# Patient Record
Sex: Female | Born: 1935 | Race: Black or African American | Hispanic: No | Smoking: Never smoker
Health system: Southern US, Community
[De-identification: ages and names within clinical notes are randomized; demographics above are authoritative.]

## PROBLEM LIST (undated history)

## (undated) DIAGNOSIS — M1990 Unspecified osteoarthritis, unspecified site: Secondary | ICD-10-CM

## (undated) DIAGNOSIS — M1710 Unilateral primary osteoarthritis, unspecified knee: Secondary | ICD-10-CM

## (undated) DIAGNOSIS — Z853 Personal history of malignant neoplasm of breast: Secondary | ICD-10-CM

## (undated) DIAGNOSIS — M25569 Pain in unspecified knee: Secondary | ICD-10-CM

## (undated) DIAGNOSIS — T50995A Adverse effect of other drugs, medicaments and biological substances, initial encounter: Secondary | ICD-10-CM

## (undated) DIAGNOSIS — N952 Postmenopausal atrophic vaginitis: Secondary | ICD-10-CM

## (undated) DIAGNOSIS — I9589 Other hypotension: Secondary | ICD-10-CM

## (undated) DIAGNOSIS — M069 Rheumatoid arthritis, unspecified: Secondary | ICD-10-CM

## (undated) DIAGNOSIS — R42 Dizziness and giddiness: Secondary | ICD-10-CM

## (undated) DIAGNOSIS — Z201 Contact with and (suspected) exposure to tuberculosis: Secondary | ICD-10-CM

## (undated) DIAGNOSIS — H6120 Impacted cerumen, unspecified ear: Secondary | ICD-10-CM

## (undated) DIAGNOSIS — R609 Edema, unspecified: Secondary | ICD-10-CM

## (undated) DIAGNOSIS — N390 Urinary tract infection, site not specified: Secondary | ICD-10-CM

## (undated) DIAGNOSIS — D509 Iron deficiency anemia, unspecified: Secondary | ICD-10-CM

## (undated) DIAGNOSIS — Z79899 Other long term (current) drug therapy: Secondary | ICD-10-CM

## (undated) DIAGNOSIS — I5032 Chronic diastolic (congestive) heart failure: Secondary | ICD-10-CM

## (undated) DIAGNOSIS — M810 Age-related osteoporosis without current pathological fracture: Secondary | ICD-10-CM

## (undated) DIAGNOSIS — E039 Hypothyroidism, unspecified: Secondary | ICD-10-CM

## (undated) DIAGNOSIS — M25539 Pain in unspecified wrist: Secondary | ICD-10-CM

## (undated) DIAGNOSIS — R300 Dysuria: Secondary | ICD-10-CM

## (undated) DIAGNOSIS — I4891 Unspecified atrial fibrillation: Principal | ICD-10-CM

## (undated) DIAGNOSIS — M2550 Pain in unspecified joint: Secondary | ICD-10-CM

## (undated) DIAGNOSIS — S42309A Unspecified fracture of shaft of humerus, unspecified arm, initial encounter for closed fracture: Secondary | ICD-10-CM

## (undated) DIAGNOSIS — I441 Atrioventricular block, second degree: Secondary | ICD-10-CM

## (undated) DIAGNOSIS — H259 Unspecified age-related cataract: Secondary | ICD-10-CM

## (undated) DIAGNOSIS — I10 Essential (primary) hypertension: Secondary | ICD-10-CM

## (undated) DIAGNOSIS — R350 Frequency of micturition: Secondary | ICD-10-CM

## (undated) DIAGNOSIS — D638 Anemia in other chronic diseases classified elsewhere: Secondary | ICD-10-CM

## (undated) DIAGNOSIS — K210 Gastro-esophageal reflux disease with esophagitis: Secondary | ICD-10-CM

## (undated) DIAGNOSIS — K5900 Constipation, unspecified: Secondary | ICD-10-CM

## (undated) DIAGNOSIS — R5381 Other malaise: Secondary | ICD-10-CM

## (undated) HISTORY — DX: Hypothyroidism, unspecified: E03.9

## (undated) HISTORY — DX: Other hypotension: I95.89

## (undated) HISTORY — DX: Unspecified fracture of shaft of humerus, unspecified arm, initial encounter for closed fracture: S42.309A

## (undated) HISTORY — DX: Urinary tract infection, site not specified: N39.0

## (undated) HISTORY — DX: Postmenopausal atrophic vaginitis: N95.2

## (undated) HISTORY — DX: Contact with and (suspected) exposure to tuberculosis: Z20.1

## (undated) HISTORY — DX: Chronic diastolic (congestive) heart failure: I50.32

## (undated) HISTORY — DX: Pain in unspecified wrist: M25.539

## (undated) HISTORY — DX: Adverse effect of other drugs, medicaments and biological substances, initial encounter: T50.995A

## (undated) HISTORY — PX: CATARACT EXTRACTION W/ INTRAOCULAR LENS  IMPLANT, BILATERAL: SHX1307

## (undated) HISTORY — DX: Impacted cerumen, unspecified ear: H61.20

## (undated) HISTORY — DX: Iron deficiency anemia, unspecified: D50.9

## (undated) HISTORY — DX: Anemia in other chronic diseases classified elsewhere: D63.8

## (undated) HISTORY — DX: Constipation, unspecified: K59.00

## (undated) HISTORY — DX: Unspecified age-related cataract: H25.9

## (undated) HISTORY — DX: Rheumatoid arthritis, unspecified: M06.9

## (undated) HISTORY — DX: Frequency of micturition: R35.0

## (undated) HISTORY — DX: Pain in unspecified knee: M25.569

## (undated) HISTORY — DX: Other malaise: R53.81

## (undated) HISTORY — DX: Gastro-esophageal reflux disease with esophagitis: K21.0

## (undated) HISTORY — DX: Edema, unspecified: R60.9

## (undated) HISTORY — DX: Dysuria: R30.0

## (undated) HISTORY — DX: Unspecified atrial fibrillation: I48.91

## (undated) HISTORY — DX: Unilateral primary osteoarthritis, unspecified knee: M17.10

## (undated) HISTORY — DX: Dizziness and giddiness: R42

## (undated) HISTORY — DX: Other long term (current) drug therapy: Z79.899

## (undated) HISTORY — PX: JOINT REPLACEMENT: SHX530

## (undated) HISTORY — DX: Age-related osteoporosis without current pathological fracture: M81.0

## (undated) HISTORY — DX: Personal history of malignant neoplasm of breast: Z85.3

## (undated) HISTORY — DX: Pain in unspecified joint: M25.50

## (undated) HISTORY — DX: Essential (primary) hypertension: I10

## (undated) HISTORY — DX: Atrioventricular block, second degree: I44.1

---

## 1958-06-27 DIAGNOSIS — Z201 Contact with and (suspected) exposure to tuberculosis: Secondary | ICD-10-CM

## 1958-06-27 HISTORY — DX: Contact with and (suspected) exposure to tuberculosis: Z20.1

## 1976-06-27 HISTORY — PX: APPENDECTOMY: SHX54

## 1976-06-27 HISTORY — PX: ABDOMINAL HYSTERECTOMY: SHX81

## 1996-06-27 DIAGNOSIS — Z853 Personal history of malignant neoplasm of breast: Secondary | ICD-10-CM

## 1996-06-27 HISTORY — DX: Personal history of malignant neoplasm of breast: Z85.3

## 1996-06-27 HISTORY — PX: MASTECTOMY: SHX3

## 1996-06-27 HISTORY — PX: BREAST SURGERY: SHX581

## 1997-09-03 ENCOUNTER — Ambulatory Visit (HOSPITAL_COMMUNITY): Admission: RE | Admit: 1997-09-03 | Discharge: 1997-09-03 | Payer: Self-pay | Admitting: General Surgery

## 1998-09-15 ENCOUNTER — Encounter: Payer: Self-pay | Admitting: *Deleted

## 1998-09-15 ENCOUNTER — Ambulatory Visit (HOSPITAL_COMMUNITY): Admission: RE | Admit: 1998-09-15 | Discharge: 1998-09-15 | Payer: Self-pay | Admitting: *Deleted

## 1999-09-29 ENCOUNTER — Ambulatory Visit (HOSPITAL_COMMUNITY): Admission: RE | Admit: 1999-09-29 | Discharge: 1999-09-29 | Payer: Self-pay | Admitting: General Surgery

## 2000-07-21 ENCOUNTER — Encounter: Payer: Self-pay | Admitting: Specialist

## 2000-07-21 ENCOUNTER — Emergency Department (HOSPITAL_COMMUNITY): Admission: EM | Admit: 2000-07-21 | Discharge: 2000-07-21 | Payer: Self-pay | Admitting: Emergency Medicine

## 2000-07-21 ENCOUNTER — Encounter: Admission: RE | Admit: 2000-07-21 | Discharge: 2000-07-21 | Payer: Self-pay | Admitting: Specialist

## 2000-10-03 ENCOUNTER — Encounter (HOSPITAL_BASED_OUTPATIENT_CLINIC_OR_DEPARTMENT_OTHER): Payer: Self-pay | Admitting: General Surgery

## 2000-10-03 ENCOUNTER — Ambulatory Visit (HOSPITAL_COMMUNITY): Admission: RE | Admit: 2000-10-03 | Discharge: 2000-10-03 | Payer: Self-pay | Admitting: General Surgery

## 2001-06-27 HISTORY — PX: EYE SURGERY: SHX253

## 2001-10-09 ENCOUNTER — Ambulatory Visit (HOSPITAL_COMMUNITY): Admission: RE | Admit: 2001-10-09 | Discharge: 2001-10-09 | Payer: Self-pay | Admitting: Obstetrics

## 2001-10-09 ENCOUNTER — Encounter: Payer: Self-pay | Admitting: Obstetrics

## 2002-11-06 ENCOUNTER — Encounter (HOSPITAL_BASED_OUTPATIENT_CLINIC_OR_DEPARTMENT_OTHER): Payer: Self-pay | Admitting: General Surgery

## 2002-11-06 ENCOUNTER — Ambulatory Visit (HOSPITAL_COMMUNITY): Admission: RE | Admit: 2002-11-06 | Discharge: 2002-11-06 | Payer: Self-pay | Admitting: General Surgery

## 2003-11-07 ENCOUNTER — Ambulatory Visit (HOSPITAL_COMMUNITY): Admission: RE | Admit: 2003-11-07 | Discharge: 2003-11-07 | Payer: Self-pay | Admitting: Obstetrics

## 2004-02-11 ENCOUNTER — Emergency Department (HOSPITAL_COMMUNITY): Admission: EM | Admit: 2004-02-11 | Discharge: 2004-02-11 | Payer: Self-pay | Admitting: *Deleted

## 2004-11-15 ENCOUNTER — Ambulatory Visit (HOSPITAL_COMMUNITY): Admission: RE | Admit: 2004-11-15 | Discharge: 2004-11-15 | Payer: Self-pay | Admitting: Obstetrics

## 2005-07-17 DIAGNOSIS — R42 Dizziness and giddiness: Secondary | ICD-10-CM

## 2005-07-17 HISTORY — DX: Dizziness and giddiness: R42

## 2005-11-24 ENCOUNTER — Ambulatory Visit (HOSPITAL_COMMUNITY): Admission: RE | Admit: 2005-11-24 | Discharge: 2005-11-24 | Payer: Self-pay | Admitting: Obstetrics

## 2006-06-27 DIAGNOSIS — IMO0002 Reserved for concepts with insufficient information to code with codable children: Secondary | ICD-10-CM

## 2006-06-27 HISTORY — DX: Reserved for concepts with insufficient information to code with codable children: IMO0002

## 2006-09-05 ENCOUNTER — Encounter: Admission: RE | Admit: 2006-09-05 | Discharge: 2006-09-05 | Payer: Self-pay | Admitting: Cardiology

## 2006-09-07 ENCOUNTER — Encounter: Admission: RE | Admit: 2006-09-07 | Discharge: 2006-09-07 | Payer: Self-pay | Admitting: Orthopedic Surgery

## 2006-10-05 ENCOUNTER — Encounter (HOSPITAL_COMMUNITY): Admission: RE | Admit: 2006-10-05 | Discharge: 2006-10-05 | Payer: Self-pay | Admitting: Cardiology

## 2006-10-17 ENCOUNTER — Inpatient Hospital Stay (HOSPITAL_COMMUNITY): Admission: RE | Admit: 2006-10-17 | Discharge: 2006-10-20 | Payer: Self-pay | Admitting: Orthopedic Surgery

## 2006-12-13 ENCOUNTER — Ambulatory Visit (HOSPITAL_COMMUNITY): Admission: RE | Admit: 2006-12-13 | Discharge: 2006-12-13 | Payer: Self-pay | Admitting: Obstetrics

## 2006-12-27 ENCOUNTER — Encounter: Admission: RE | Admit: 2006-12-27 | Discharge: 2006-12-27 | Payer: Self-pay | Admitting: Obstetrics

## 2007-02-13 ENCOUNTER — Encounter: Admission: RE | Admit: 2007-02-13 | Discharge: 2007-02-13 | Payer: Self-pay | Admitting: Orthopedic Surgery

## 2007-05-07 ENCOUNTER — Ambulatory Visit (HOSPITAL_COMMUNITY): Admission: RE | Admit: 2007-05-07 | Discharge: 2007-05-07 | Payer: Self-pay | Admitting: Orthopedic Surgery

## 2007-05-17 ENCOUNTER — Inpatient Hospital Stay (HOSPITAL_COMMUNITY): Admission: RE | Admit: 2007-05-17 | Discharge: 2007-05-21 | Payer: Self-pay | Admitting: Orthopedic Surgery

## 2007-12-17 ENCOUNTER — Ambulatory Visit (HOSPITAL_COMMUNITY): Admission: RE | Admit: 2007-12-17 | Discharge: 2007-12-17 | Payer: Self-pay | Admitting: Obstetrics

## 2008-12-17 ENCOUNTER — Ambulatory Visit (HOSPITAL_COMMUNITY): Admission: RE | Admit: 2008-12-17 | Discharge: 2008-12-17 | Payer: Self-pay | Admitting: Cardiology

## 2009-05-26 ENCOUNTER — Encounter: Admission: RE | Admit: 2009-05-26 | Discharge: 2009-05-26 | Payer: Self-pay | Admitting: Cardiology

## 2009-12-21 ENCOUNTER — Ambulatory Visit (HOSPITAL_COMMUNITY): Admission: RE | Admit: 2009-12-21 | Discharge: 2009-12-21 | Payer: Self-pay | Admitting: *Deleted

## 2010-04-08 ENCOUNTER — Encounter: Admission: RE | Admit: 2010-04-08 | Discharge: 2010-04-08 | Payer: Self-pay | Admitting: Cardiology

## 2010-06-27 DIAGNOSIS — I10 Essential (primary) hypertension: Secondary | ICD-10-CM

## 2010-06-27 DIAGNOSIS — R609 Edema, unspecified: Secondary | ICD-10-CM

## 2010-06-27 DIAGNOSIS — I441 Atrioventricular block, second degree: Secondary | ICD-10-CM

## 2010-06-27 DIAGNOSIS — E039 Hypothyroidism, unspecified: Secondary | ICD-10-CM

## 2010-06-27 DIAGNOSIS — I5032 Chronic diastolic (congestive) heart failure: Secondary | ICD-10-CM

## 2010-06-27 HISTORY — DX: Atrioventricular block, second degree: I44.1

## 2010-06-27 HISTORY — DX: Edema, unspecified: R60.9

## 2010-06-27 HISTORY — DX: Essential (primary) hypertension: I10

## 2010-06-27 HISTORY — DX: Chronic diastolic (congestive) heart failure: I50.32

## 2010-06-27 HISTORY — DX: Hypothyroidism, unspecified: E03.9

## 2010-06-29 ENCOUNTER — Encounter
Admission: RE | Admit: 2010-06-29 | Discharge: 2010-06-29 | Payer: Self-pay | Source: Home / Self Care | Attending: Cardiology | Admitting: Cardiology

## 2010-07-06 ENCOUNTER — Inpatient Hospital Stay (HOSPITAL_COMMUNITY)
Admission: EM | Admit: 2010-07-06 | Discharge: 2010-07-16 | Payer: Self-pay | Source: Home / Self Care | Attending: Cardiology | Admitting: Cardiology

## 2010-07-07 ENCOUNTER — Encounter (INDEPENDENT_AMBULATORY_CARE_PROVIDER_SITE_OTHER): Payer: Self-pay | Admitting: Cardiology

## 2010-07-12 ENCOUNTER — Ambulatory Visit: Admit: 2010-07-12 | Payer: Self-pay | Admitting: Cardiology

## 2010-07-12 LAB — PROTIME-INR
INR: 1.05 (ref 0.00–1.49)
Prothrombin Time: 13.9 seconds (ref 11.6–15.2)

## 2010-07-12 LAB — DIFFERENTIAL
Basophils Absolute: 0 10*3/uL (ref 0.0–0.1)
Basophils Relative: 0 % (ref 0–1)
Eosinophils Absolute: 0 10*3/uL (ref 0.0–0.7)
Eosinophils Relative: 0 % (ref 0–5)
Lymphocytes Relative: 16 % (ref 12–46)
Lymphs Abs: 1.9 10*3/uL (ref 0.7–4.0)
Monocytes Absolute: 1.3 10*3/uL — ABNORMAL HIGH (ref 0.1–1.0)
Monocytes Relative: 11 % (ref 3–12)
Neutro Abs: 8.2 10*3/uL — ABNORMAL HIGH (ref 1.7–7.7)
Neutrophils Relative %: 72 % (ref 43–77)

## 2010-07-12 LAB — CBC
HCT: 32.3 % — ABNORMAL LOW (ref 36.0–46.0)
HCT: 26.2 % — ABNORMAL LOW (ref 36.0–46.0)
HCT: 24.5 % — ABNORMAL LOW (ref 36.0–46.0)
HCT: 27.4 % — ABNORMAL LOW (ref 36.0–46.0)
HCT: 31 % — ABNORMAL LOW (ref 36.0–46.0)
Hemoglobin: 10.4 g/dL — ABNORMAL LOW (ref 12.0–15.0)
Hemoglobin: 8.4 g/dL — ABNORMAL LOW (ref 12.0–15.0)
Hemoglobin: 7.7 g/dL — ABNORMAL LOW (ref 12.0–15.0)
Hemoglobin: 8.8 g/dL — ABNORMAL LOW (ref 12.0–15.0)
Hemoglobin: 10.1 g/dL — ABNORMAL LOW (ref 12.0–15.0)
MCH: 27.4 pg (ref 26.0–34.0)
MCH: 27.5 pg (ref 26.0–34.0)
MCH: 26.8 pg (ref 26.0–34.0)
MCH: 27.5 pg (ref 26.0–34.0)
MCH: 28 pg (ref 26.0–34.0)
MCHC: 32.2 g/dL (ref 30.0–36.0)
MCHC: 32.1 g/dL (ref 30.0–36.0)
MCHC: 31.4 g/dL (ref 30.0–36.0)
MCHC: 32.1 g/dL (ref 30.0–36.0)
MCHC: 32.6 g/dL (ref 30.0–36.0)
MCV: 85 fL (ref 78.0–100.0)
MCV: 85.6 fL (ref 78.0–100.0)
MCV: 85.4 fL (ref 78.0–100.0)
MCV: 85.6 fL (ref 78.0–100.0)
MCV: 85.9 fL (ref 78.0–100.0)
Platelets: 302 10*3/uL (ref 150–400)
Platelets: 271 10*3/uL (ref 150–400)
Platelets: 231 10*3/uL (ref 150–400)
Platelets: 221 10*3/uL (ref 150–400)
Platelets: 233 10*3/uL (ref 150–400)
RBC: 3.8 MIL/uL — ABNORMAL LOW (ref 3.87–5.11)
RBC: 3.06 MIL/uL — ABNORMAL LOW (ref 3.87–5.11)
RBC: 2.87 MIL/uL — ABNORMAL LOW (ref 3.87–5.11)
RBC: 3.2 MIL/uL — ABNORMAL LOW (ref 3.87–5.11)
RBC: 3.61 MIL/uL — ABNORMAL LOW (ref 3.87–5.11)
RDW: 14.3 % (ref 11.5–15.5)
RDW: 14.6 % (ref 11.5–15.5)
RDW: 14.6 % (ref 11.5–15.5)
RDW: 14.6 % (ref 11.5–15.5)
RDW: 15 % (ref 11.5–15.5)
WBC: 11.4 10*3/uL — ABNORMAL HIGH (ref 4.0–10.5)
WBC: 10 10*3/uL (ref 4.0–10.5)
WBC: 9.4 10*3/uL (ref 4.0–10.5)
WBC: 9.5 10*3/uL (ref 4.0–10.5)
WBC: 10.7 10*3/uL — ABNORMAL HIGH (ref 4.0–10.5)

## 2010-07-12 LAB — BASIC METABOLIC PANEL
BUN: 9 mg/dL (ref 6–23)
BUN: 4 mg/dL — ABNORMAL LOW (ref 6–23)
BUN: 8 mg/dL (ref 6–23)
CO2: 22 mEq/L (ref 19–32)
CO2: 23 mEq/L (ref 19–32)
CO2: 25 mEq/L (ref 19–32)
Calcium: 7.5 mg/dL — ABNORMAL LOW (ref 8.4–10.5)
Calcium: 8 mg/dL — ABNORMAL LOW (ref 8.4–10.5)
Calcium: 8.4 mg/dL (ref 8.4–10.5)
Chloride: 110 mEq/L (ref 96–112)
Chloride: 114 mEq/L — ABNORMAL HIGH (ref 96–112)
Chloride: 110 mEq/L (ref 96–112)
Creatinine, Ser: 1.19 mg/dL (ref 0.4–1.2)
Creatinine, Ser: 1.07 mg/dL (ref 0.4–1.2)
Creatinine, Ser: 1.12 mg/dL (ref 0.4–1.2)
GFR calc Af Amer: 52 mL/min — ABNORMAL LOW (ref >60)
GFR calc Af Amer: 59 mL/min — ABNORMAL LOW (ref >60)
GFR calc Af Amer: 56 mL/min — ABNORMAL LOW (ref >60)
GFR calc non Af Amer: 43 mL/min — ABNORMAL LOW (ref >60)
GFR calc non Af Amer: 48 mL/min — ABNORMAL LOW (ref >60)
GFR calc non Af Amer: 46 mL/min — ABNORMAL LOW (ref >60)
Glucose, Bld: 115 mg/dL — ABNORMAL HIGH (ref 70–99)
Glucose, Bld: 116 mg/dL — ABNORMAL HIGH (ref 70–99)
Glucose, Bld: 84 mg/dL (ref 70–99)
Potassium: 3.3 mEq/L — ABNORMAL LOW (ref 3.5–5.1)
Potassium: 3.8 mEq/L (ref 3.5–5.1)
Potassium: 3.9 mEq/L (ref 3.5–5.1)
Sodium: 138 mEq/L (ref 135–145)
Sodium: 141 mEq/L (ref 135–145)
Sodium: 141 mEq/L (ref 135–145)

## 2010-07-12 LAB — TROPONIN I: Troponin I: 0.12 ng/mL — ABNORMAL HIGH (ref 0.00–0.06)

## 2010-07-12 LAB — CARDIAC PANEL(CRET KIN+CKTOT+MB+TROPI)
CK, MB: 7 ng/mL (ref 0.3–4.0)
CK, MB: 4.8 ng/mL — ABNORMAL HIGH (ref 0.3–4.0)
CK, MB: 3.8 ng/mL (ref 0.3–4.0)
Relative Index: INVALID (ref 0.0–2.5)
Relative Index: INVALID (ref 0.0–2.5)
Relative Index: INVALID (ref 0.0–2.5)
Total CK: 50 U/L (ref 7–177)
Total CK: 43 U/L (ref 7–177)
Total CK: 48 U/L (ref 7–177)
Troponin I: 0.17 ng/mL — ABNORMAL HIGH (ref 0.00–0.06)
Troponin I: 0.16 ng/mL — ABNORMAL HIGH (ref 0.00–0.06)
Troponin I: 0.11 ng/mL — ABNORMAL HIGH (ref 0.00–0.06)

## 2010-07-12 LAB — FERRITIN: Ferritin: 725 ng/mL — ABNORMAL HIGH (ref 10–291)

## 2010-07-12 LAB — COMPREHENSIVE METABOLIC PANEL
ALT: 43 U/L — ABNORMAL HIGH (ref 0–35)
AST: 32 U/L (ref 0–37)
Albumin: 2.8 g/dL — ABNORMAL LOW (ref 3.5–5.2)
Alkaline Phosphatase: 76 U/L (ref 39–117)
BUN: 20 mg/dL (ref 6–23)
CO2: 26 mEq/L (ref 19–32)
Calcium: 8.8 mg/dL (ref 8.4–10.5)
Chloride: 104 mEq/L (ref 96–112)
Creatinine, Ser: 1.58 mg/dL — ABNORMAL HIGH (ref 0.4–1.2)
GFR calc Af Amer: 37 mL/min — ABNORMAL LOW (ref >60)
GFR calc non Af Amer: 31 mL/min — ABNORMAL LOW (ref >60)
Glucose, Bld: 119 mg/dL — ABNORMAL HIGH (ref 70–99)
Potassium: 4.6 mEq/L (ref 3.5–5.1)
Sodium: 138 mEq/L (ref 135–145)
Total Bilirubin: 0.7 mg/dL (ref 0.3–1.2)
Total Protein: 5.7 g/dL — ABNORMAL LOW (ref 6.0–8.3)

## 2010-07-12 LAB — FOLATE: Folate: >20 ng/mL

## 2010-07-12 LAB — CROSSMATCH
ABO/RH(D): O POS
Antibody Screen: NEGATIVE
Unit division: 0
Unit division: 0

## 2010-07-12 LAB — IRON AND TIBC
Iron: 24 ug/dL — ABNORMAL LOW (ref 42–135)
Iron: 31 ug/dL — ABNORMAL LOW (ref 42–135)
Saturation Ratios: 15 % — ABNORMAL LOW (ref 20–55)
Saturation Ratios: 19 % — ABNORMAL LOW (ref 20–55)
TIBC: 155 ug/dL — ABNORMAL LOW (ref 250–470)
TIBC: 159 ug/dL — ABNORMAL LOW (ref 250–470)
UIBC: 131 ug/dL
UIBC: 128 ug/dL

## 2010-07-12 LAB — POCT CARDIAC MARKERS
CKMB, poc: 7.2 ng/mL (ref 1.0–8.0)
CKMB, poc: 6.2 ng/mL (ref 1.0–8.0)
Myoglobin, poc: 200 ng/mL (ref 12–200)
Myoglobin, poc: 163 ng/mL (ref 12–200)
Troponin i, poc: 0.11 ng/mL — ABNORMAL HIGH (ref 0.00–0.09)
Troponin i, poc: 0.12 ng/mL — ABNORMAL HIGH (ref 0.00–0.09)

## 2010-07-12 LAB — MRSA PCR SCREENING: MRSA by PCR: NEGATIVE

## 2010-07-12 LAB — URINALYSIS, ROUTINE W REFLEX MICROSCOPIC
Bilirubin Urine: NEGATIVE
Hgb urine dipstick: NEGATIVE
Ketones, ur: NEGATIVE mg/dL
Nitrite: NEGATIVE
Protein, ur: NEGATIVE mg/dL
Specific Gravity, Urine: 1.012 (ref 1.005–1.030)
Urine Glucose, Fasting: NEGATIVE mg/dL
Urobilinogen, UA: 0.2 mg/dL (ref 0.0–1.0)
pH: 6 (ref 5.0–8.0)

## 2010-07-12 LAB — BRAIN NATRIURETIC PEPTIDE
Pro B Natriuretic peptide (BNP): 225 pg/mL — ABNORMAL HIGH (ref 0.0–100.0)
Pro B Natriuretic peptide (BNP): 516 pg/mL — ABNORMAL HIGH (ref 0.0–100.0)
Pro B Natriuretic peptide (BNP): 516 pg/mL — ABNORMAL HIGH (ref 0.0–100.0)

## 2010-07-12 LAB — URINE CULTURE
Colony Count: NO GROWTH
Culture  Setup Time: 201201102015
Culture: NO GROWTH

## 2010-07-12 LAB — T4: T4, Total: 10.2 ug/dL (ref 5.0–12.5)

## 2010-07-12 LAB — VITAMIN B12: Vitamin B-12: 480 pg/mL (ref 211–911)

## 2010-07-12 LAB — AMYLASE: Amylase: 33 U/L (ref 0–105)

## 2010-07-12 LAB — DIGOXIN LEVEL
Digoxin Level: 2.8 ng/mL (ref 0.8–2.0)
Digoxin Level: 0.8 ng/mL (ref 0.8–2.0)

## 2010-07-12 LAB — CK TOTAL AND CKMB (NOT AT ARMC)
CK, MB: 4.1 ng/mL — ABNORMAL HIGH (ref 0.3–4.0)
Relative Index: INVALID (ref 0.0–2.5)
Total CK: 45 U/L (ref 7–177)

## 2010-07-12 LAB — PREALBUMIN: Prealbumin: 23 mg/dL (ref 17.0–34.0)

## 2010-07-12 LAB — APTT: aPTT: 26 seconds (ref 24–37)

## 2010-07-12 LAB — HEMOCCULT GUIAC POC 1CARD (OFFICE): Fecal Occult Bld: NEGATIVE

## 2010-07-12 LAB — LIPASE, BLOOD: Lipase: 15 U/L (ref 11–59)

## 2010-07-12 LAB — TSH: TSH: 0.011 u[IU]/mL — ABNORMAL LOW (ref 0.350–4.500)

## 2010-07-14 LAB — BASIC METABOLIC PANEL
BUN: 10 mg/dL (ref 6–23)
BUN: 9 mg/dL (ref 6–23)
CO2: 25 mEq/L (ref 19–32)
CO2: 27 mEq/L (ref 19–32)
Calcium: 8.3 mg/dL — ABNORMAL LOW (ref 8.4–10.5)
Calcium: 8.6 mg/dL (ref 8.4–10.5)
Chloride: 106 mEq/L (ref 96–112)
Chloride: 109 mEq/L (ref 96–112)
Creatinine, Ser: 1.14 mg/dL (ref 0.4–1.2)
Creatinine, Ser: 1.03 mg/dL (ref 0.4–1.2)
GFR calc Af Amer: 54 mL/min — ABNORMAL LOW (ref >60)
GFR calc Af Amer: >60 mL/min (ref >60)
GFR calc non Af Amer: 45 mL/min — ABNORMAL LOW (ref >60)
GFR calc non Af Amer: 51 mL/min — ABNORMAL LOW (ref >60)
Glucose, Bld: 89 mg/dL (ref 70–99)
Glucose, Bld: 85 mg/dL (ref 70–99)
Potassium: 3.8 mEq/L (ref 3.5–5.1)
Potassium: 3.9 mEq/L (ref 3.5–5.1)
Sodium: 137 mEq/L (ref 135–145)
Sodium: 141 mEq/L (ref 135–145)

## 2010-07-14 LAB — CULTURE, BLOOD (ROUTINE X 2)
Culture  Setup Time: 201201110549
Culture  Setup Time: 201201110549
Culture: NO GROWTH
Culture: NO GROWTH

## 2010-07-14 LAB — BRAIN NATRIURETIC PEPTIDE
Pro B Natriuretic peptide (BNP): 341 pg/mL — ABNORMAL HIGH (ref 0.0–100.0)
Pro B Natriuretic peptide (BNP): 299 pg/mL — ABNORMAL HIGH (ref 0.0–100.0)

## 2010-07-14 LAB — CBC
HCT: 31.6 % — ABNORMAL LOW (ref 36.0–46.0)
Hemoglobin: 10.1 g/dL — ABNORMAL LOW (ref 12.0–15.0)
MCH: 27.5 pg (ref 26.0–34.0)
MCHC: 32 g/dL (ref 30.0–36.0)
MCV: 86.1 fL (ref 78.0–100.0)
Platelets: 234 10*3/uL (ref 150–400)
RBC: 3.67 MIL/uL — ABNORMAL LOW (ref 3.87–5.11)
RDW: 15.3 % (ref 11.5–15.5)
WBC: 11.2 10*3/uL — ABNORMAL HIGH (ref 4.0–10.5)

## 2010-07-14 LAB — GLUCOSE, CAPILLARY: Glucose-Capillary: 119 mg/dL — ABNORMAL HIGH (ref 70–99)

## 2010-07-14 LAB — CARDIAC PANEL(CRET KIN+CKTOT+MB+TROPI)
CK, MB: 1.4 ng/mL (ref 0.3–4.0)
Relative Index: INVALID (ref 0.0–2.5)
Total CK: 17 U/L (ref 7–177)
Troponin I: 0.04 ng/mL (ref 0.00–0.06)

## 2010-07-16 DIAGNOSIS — R5381 Other malaise: Secondary | ICD-10-CM

## 2010-07-16 HISTORY — DX: Other malaise: R53.81

## 2010-07-17 DIAGNOSIS — I9589 Other hypotension: Secondary | ICD-10-CM

## 2010-07-17 DIAGNOSIS — T50995A Adverse effect of other drugs, medicaments and biological substances, initial encounter: Secondary | ICD-10-CM

## 2010-07-17 HISTORY — DX: Other hypotension: I95.89

## 2010-07-17 HISTORY — DX: Adverse effect of other drugs, medicaments and biological substances, initial encounter: T50.995A

## 2010-07-18 ENCOUNTER — Encounter: Payer: Self-pay | Admitting: Obstetrics

## 2010-07-19 LAB — PROTIME-INR: INR: 1.05 (ref 0.00–1.49)

## 2010-07-19 LAB — BASIC METABOLIC PANEL
BUN: 12 mg/dL (ref 6–23)
Calcium: 8.7 mg/dL (ref 8.4–10.5)
GFR calc non Af Amer: 46 mL/min — ABNORMAL LOW (ref >60)
Potassium: 3.8 mEq/L (ref 3.5–5.1)
Sodium: 140 mEq/L (ref 135–145)

## 2010-07-19 LAB — CBC
Platelets: 284 10*3/uL (ref 150–400)
RDW: 15.4 % (ref 11.5–15.5)
WBC: 9 10*3/uL (ref 4.0–10.5)

## 2010-07-19 LAB — HEPARIN LEVEL (UNFRACTIONATED): Heparin Unfractionated: 0.35 IU/mL (ref 0.30–0.70)

## 2010-07-20 DIAGNOSIS — M2550 Pain in unspecified joint: Secondary | ICD-10-CM

## 2010-07-20 HISTORY — DX: Pain in unspecified joint: M25.50

## 2010-07-23 NOTE — Consult Note (Signed)
NAME:  Kimberly Gaines, Kimberly Gaines NO.:  [id number]  MEDICAL RECORD NO.:  [id number]          PATIENT TYPE:  INP  LOCATION:  6729                         FACILITY:  [facility]  PHYSICIAN:  Jordan Hawks. Elnoria Howard, MD    DATE OF BIRTH:  Jan 03, 1920  DATE OF CONSULTATION:  07/13/2010 DATE OF DISCHARGE:                                CONSULTATION   REFERRING PHYSICIAN:  Osvaldo Shipper. Spruill, MD  CARDIOLOGIST:  Cristy Hilts. Jacinto Halim, MD  REASON FOR CONSULTATION:  Anemia.  HISTORY OF PRESENT ILLNESS:  This is an 77 year old female with a past medical history of hyperthyroidism, hypertension, and chronic anemia who was admitted to the hospital with complaints of nausea and vomiting. During the course of her hospitalization, she was noted to be toxic from digoxin and subsequently she was also confirmed to have a non-ST MI. She has improved steadily over the time and currently she is without any complaints of chest pain or shortness of breath and also she does not have any further nausea and vomiting.  Her digoxin level has normalized at this time.  During this interval time, her hemoglobin was found to have dropped down into the 7 range.  Her baseline hemoglobin appears to range anywhere from 9-11.  There are no reports of any hematochezia or melena.  No abdominal pain, diarrhea, or constipation.  In the past, the patient was evaluated in my office in 2009 for hematochezia complaints. At that time, she was recommended to undergo colonoscopy, however, there appeared to be some issue with hypertension, and the colonoscopy was cancelled, however, upon trying to reschedule the patient, it was difficult to get back in touch with the patient.  At this time, GI is requested for a repeat evaluation of this patient's anemia.  PAST MEDICAL HISTORY AND PAST SURGICAL HISTORY:  As stated above.  FAMILY HISTORY:  Noncontributory.  SOCIAL HISTORY:  Negative for alcohol, tobacco, or illicit drug use.  REVIEW OF  SYSTEMS:  As stated above in the history of present illness, otherwise negative.  MEDICATIONS: 1. Aspirin 81 mg p.o. daily. 2. Carvedilol 3.125 mg p.o. b.i.d. 3. Colace 100 mg p.o. daily. 4. Lovenox 30 mg subcu daily. 5. Iron is 325 mg p.o. b.i.d. 6. Isosorbide 60 mg p.o. daily. 7. Levothyroxine 50 mcg p.o. daily. 8. Protonix 80 mg p.o. daily. 9. Prednisone 10 mg p.o. daily. 10.Spironolactone 25 mg p.o. b.i.d. 11.Milk of magnesia 30 mL p.o. daily p.r.n.  ALLERGIES:  No known drug allergies.  PHYSICAL EXAMINATION:  VITAL SIGNS:  Blood pressure is 155/79, heart rate is 62, respirations 18, temperature is 98.2. GENERAL:  The patient is in no acute distress, alert, and oriented. HEENT:  Normocephalic, atraumatic.  Extraocular muscles are intact. NECK:  Supple.  No lymphadenopathy. LUNGS:  Clear to auscultation bilaterally. CARDIOVASCULAR:  Regular rate and rhythm. ABDOMEN:  Flat, soft, nontender, and nondistended. EXTREMITIES:  No clubbing, cyanosis, or edema.  LABORATORY VALUES:  White blood cell count is 11.2, hemoglobin 10.1, MCV is 86.1, platelets at 234.  Sodium is 141, potassium 3.9, chloride 109, CO2 of 27, glucose 85, BUN 9, creatinine 1.0.  BNP is 299.  IMPRESSION: 1. Chronic anemia.  2. Non-ST segment myocardial infarction. 3. Congestive heart failure. 4. Recent digoxin toxicity.  At this time, the patient appears to be stable.  I do not believe that there is any emergent need to perform an endoscopic workup. Additionally, because of her non-ST MI, an endoscopic workup at this time is not prudent.  She at least for the colonoscopy, 6 months will need to elapse before consideration for colonoscopy.  She has not had any further hematochezia since the 2009 evaluation.  Her baseline anemia does range anywhere from 9-11 and her iron saturation is at 90% which is borderline.  MCV is within normal range.  I doubt that the patient has any significant GI source.  I cannot  completely explain the anemia could be from chronic disease issues.  Again, she is Hemoccult negative currently.  PLAN: 1. I will have the patient follow up with me in the office, and I will     track her hemoglobin along.  Further recommendations will be made     as to the timing and if it is prudent to perform endoscopic     evaluation at this time. 2. Continue with the current cardiac care.     Jordan Hawks Elnoria Howard, MD     PDH/MEDQ  D:  07/13/2010  T:  07/14/2010  Job:  [id number]  cc:   Osvaldo Shipper. Spruill, M.D.  Electronically Signed by Jeani Hawking MD on 07/21/2010 07:18:19 AM

## 2010-08-04 NOTE — Discharge Summary (Signed)
NAME:  Kimberly Gaines, Kimberly Gaines NO.:  [id number]  MEDICAL RECORD NO.:  [id number]          PATIENT TYPE:  INP  LOCATION:  6729                         FACILITY:  [facility]  PHYSICIAN:  Osvaldo Shipper. Kennieth Plotts, M.D.DATE OF BIRTH:  23-Mar-1920  DATE OF ADMISSION:  07/06/2010 DATE OF DISCHARGE:                        DISCHARGE SUMMARY - REFERRING   PROVISIONAL DIAGNOSIS: 1. Possible non-ST elevated myocardial infarction. 2. Orthostatic hypotension. 3. Dehydration. 4. Mild renal insufficiency. 5. Mild anemia. 6. Leukocytosis. 7. Protein calorie malnutrition. 8. Sinus pauses.  DISCHARGE DIAGNOSES: 1. Diastolic heart failure. 2. Digoxin toxicity. 3. Iron deficiency anemia. 4. Hypotension secondary to anemia as well as dehydration. 5. Mobitz 2 block. 6. Hypokalemia. 7. Mild nutrition protein calorie.  BRIEF HISTORY AND REASON FOR ADMISSION:  This 77 year old black woman who was very active and a resident [facility] facility had developed gradual difficulty with loss of appetite and vomiting on several occasions.  The patient had began to lose weight and had developed profound weakness.  The patient had orthostatic blood pressures taken by the nurse at [facility] and found to have at least a 20 point graded drop in her blood pressure.  The patient had lost her appetite and had become dehydrated and subsequently was admitted to the hospital for further evaluation where she became extremely dizzy.  The patient had been previously seen by orthopedist, had been prescribed medication for degenerative joint disease, which she continues to have difficulty with and also been taking Aleve on her own.  Due to the patient's complaints of weakness, dizziness, poor appetite, apparent clinical dehydration, she was admitted for further evaluation and treatment.  LABORATORY STUDIES: 1. The admission laboratory blood work revealed the following, on     January 10, white  blood cell count recorded at 11,400, hemoglobin     10.4, hematocrit 32.3.  The cardiac markers on initial presentation     reveal evidence of troponin I of  0.12, myoglobin 163.  Urinalysis     on January 10, yellow clear, specific gravity 1.012, negative for     glucose, bilirubin, ketones or blood.  The MRSA PCR screen was     negative. 2. B-type natriuretic peptide was recorded 225, elevated normal is     between 0 and 100. Serum amylase of 33 on admission and subsequent     followup cardiac panel revealed a CK-MB of 7.0, creatinine kinase     was 50, troponin was 0.17.  Serum digoxin level was 2.8, normal was     0.82 and lipase was recorded at 15. 3. CBC on January 11 revealed hemoglobin of 8.4 a drop from the prior     and hematocrit of 26.2.  Cardiac panel on the 11th, revealed a CK-     MB of 4.8, troponin 0.16, CK-MB followup revealed a CK of 4.1,     troponin I of 0.12. 4. thyroid function was essentially unremarkable.  Serum iron was     recorded at 24, which is low, total iron-binding capacity 155 was     also low, saturation 15 was also low.  The patient's cardiac panel     on January 11 revealed troponin  0.11.  Urine culture revealed no     growth.  Stool for occult blood on January 12 was negative. 5. B-type natriuretic peptide on January 13 revealed a level of 516,     indicative of heart failure and the patient's hemoglobin and     hematocrit had dropped to 8.8 and 27.4 respectively. 6. B-type natriuretic peptide on January 17 was 299 and the patient     had some blood cultures performed as well due to her elevated white     blood cell count and cultures were unrevealing.  RADIOLOGIC STUDIES:  On January 3 her chest x-ray revealed mild cardiomegaly, no active lung disease.  X-ray on January 10 revealed enlargement of the cardiac silhouette, no evidence of any congestion, minimal atelectasis in the right lung base.  PROCEDURES:  Cardiac catheterization was performed  on January 19 and revealed the patient to have an ejection fraction of 50%-55%.  She likely had some diastolic dysfunction.  The left anterior descending coronary artery had a 10%-15% proximal and a 30%-40% mid-stenosis beyond the diagonal 2, circumflex was unremarkable.  The right coronary had a 10%-15% mid-stenosis.  CONSULTATIONS: 1. Dr. Jeani Hawking. gastroenterologist evaluated the patient for     possible GI bleeding and it was decided to delay her endoscopy. 2. Dr. Yates Decamp evaluated the patient for possible myocardial     infarction but we have decided to treat the patient conservatively     at the present.  HOSPITAL COURSE:  The patient was initially admitted under my care with weakness, nausea, vomiting, orthostatic hypotension.  After being admitted, baseline laboratory studies were obtained and are included in the body of the patient's chart.  These studies did reveal the patient to be mildly dehydrated with some early renal insufficiency.  After initial presentation, stool guaiacs  were requested x3 but were unremarkable.  The patient had serum iron iron-binding capacity, which revealed evidence of an iron deficiency anemia.  Due to the patient's elevated white blood cell count, blood cultures were performed and unremarkable.  Serum digoxin level did reveal an elevated digoxin level, which could account for the patient's nausea.  She was continued on medications of prednisone 10 mg p.o. daily and Synthroid 55 mcg p.o. daily.  Her digoxin was discontinued.  The patient continued to be quite weak and when she was rehydrated, revealed evidence of underlying anemia.  Anemia panel was requested and it revealed decreased serum iron and binding capacity and she was started on ferrous sulfate 325 mg twice a day.  The patient had some mild problems with hypokalemia.  This was addressed.  Also had evidence of heart failure as her B-type natriuretic peptide was elevated.  On  July 08, 2010 the patient was typed and crossed for 2 units of packed red cells and was noted that after rehydration, her hemoglobin/hematocrit had dropped.  The patient was symptomatic as she did have orthostatic hypotension.  She was treated successfully with blood, which increased her hemoglobin/hematocrit.  Intravenous fluids were also discontinued when it was found that she had some evidence of some diastolic dysfunction.  She was started on Aldactone 25 mg p.o. daily and also given Coreg for heart failure.  The patient's overall condition began to slowly improve and she was at the point where she continued to have some mid-sternal chest discomfort and further evaluation with cardiac catheterization was requested.  Cardiac cath did not reveal any significant stenosis in the patient's coronary arteries.  It is felt that  the elevation of troponin may just be due to heart failure.  The patient is ambulatory and has reached maximum hospital benefit and will be released to rehab at [facility].  DISCHARGE MEDICATIONS: 1. Carvedilol 3.125 mg twice daily. 2. Docusate sodium 100 mg daily. 3. Ferrous sulfate 325 mg p.o. b.i.d. 4. Olmesartan 20 mg, which is Benicar, daily. 5. Pantoprazole. 6. Protonix 40 mg p.o. daily. 7. Prednisone 10 mg p.o. daily. 8. Spirolactone 25 mg p.o. twice daily. 9. Alendronate 70 mg 1 tablet on Wednesday. 10.Multivitamin 1 daily. 11.Synthroid 50 mcg 1 tablet every other day. 12.The patient has been instructed to stop taking digoxin, stop the     12.5 mg of carvedilol, discontinue dexamethasone and discontinued     the 5 mg of prednisone that she was previous prescribed.  She will be seen back in my office in 2-3 weeks for further follow-up.  DISCHARGE DIET:  Will be a heart-healthy, sodium restricted diet.  DISPOSITION:  She will be released to the rehab program in [place] for additional follow-up.     Osvaldo Shipper. Natascha Edmonds,  M.D.     JOS/MEDQ  D:  07/15/2010  T:  07/15/2010  Job:  [id number]  Electronically Signed by Donia Guiles M.D. on 08/04/2010 07:23:41 PM

## 2010-08-11 NOTE — Procedures (Signed)
NAME:  Kimberly Gaines, Kimberly Gaines NO.:  [id number]  MEDICAL RECORD NO.:  [id number]          PATIENT TYPE:  INP  LOCATION:  6729                         FACILITY:  [facility]  PHYSICIAN:  Towanna Avery N. Sharyn Lull, M.D. DATE OF BIRTH:  1920/05/24  DATE OF PROCEDURE: DATE OF DISCHARGE:                           CARDIAC CATHETERIZATION   PROCEDURE:  Left cardiac catheterization with selective left and right coronary angiography, left ventriculography via right groin using Judkins technique.  INDICATIONS FOR PROCEDURE:  Kimberly Gaines is an 77 year old black female with past medical history significant for hypertension status post small non-Q-wave myocardial infarction complicated by mild congestive heart failure secondary to diastolic dysfunction, hypothyroidism, anemia, history of rheumatoid arthritis, was admitted on July 06, 2010, because of recurrent vague chest pain off and on for last 2 weeks lasting approximately 30 minutes.  The patient was noted to have a minimally elevated troponin I.  EKG showed poor R wave progression with no significant ST-T wave changes.  Due to recurrent chest discomfort, minimally elevated troponin-I, heart failure, and multiple risk factors, discussed with the patient and family regarding left cath possible PTCA and stenting, its risks and benefits i.e., death, MI, stroke, need for emergency CABG, risk of restenosis, local vascular complications, bleeding, etc., and consented for PCI.  DESCRIPTION OF PROCEDURE:  After obtaining the informed consent, the patient was brought to the cath lab and was placed on fluoroscopy table. Right groin was prepped and draped in usual fashion.  1% Xylocaine was used for local anesthesia in the right groin.  With the help of thin- wall needle 5-French arterial sheath was placed without difficulty. Sheath was aspirated and flushed.  Next, 5-French left Judkins catheter was advanced over the wire under fluoroscopic  guidance up to the ascending aorta.  Wire was pulled out, the catheter was aspirated and connected to the manifold.  Catheter was further advanced and engaged into left coronary ostium.  Multiple views of the left system were taken.  Next, the catheter was disengaged and was pulled out over the wire and was replaced with 4-French right Judkins catheter, which was advanced over the wire under fluoroscopic guidance up to the ascending aorta.  Wire was pulled out, the catheter was aspirated and connected to the manifold.  Catheter was further advanced and attempted to engage into RCA without success.  This catheter was exchanged over the wire with no torque catheter, which was advanced over the wire under fluoroscopic guidance up to the ascending aorta.  Wire was pulled out, the catheter was aspirated and connected to the manifold.  Catheter was further advanced and engaged into right coronary ostium.  Multiple views of the right system were taken.  Next, the catheter was disengaged and was pulled out over the wire and was replaced with a 5-French pigtail catheter, which was advanced over the wire under fluoroscopic guidance up to the ascending aorta.  Wire was pulled out, the catheter was aspirated and connected to the manifold.  Catheter was further advanced across the aortic valve into the LV.  LV pressures were recorded.  Next, LV graph was done in 30-degree RAO position.  Post angiographic pressures were recorded from  LV and then pullback pressures were recorded from aorta.  There was no gradient across the aortic valve. Next, the pigtail catheter was pulled out over the wire.  Sheaths were aspirated and flushed.  FINDINGS:  LV showed good LV systolic function, mild LVH, EF of 50-55%. There was 2+ catheter induced MR.  Left main was patent.  LAD has 10-15% proximal and 30-40% mid stenosis beyond diagonal 2.  Diagonal 1 was very, very small.  Diagonal 2 has 20-30% ostial stenosis.   Ramus is patent.  Left circumflex is patent.  OM1 and OM2 were very small.  OM3 was moderate size, which was patent.  RCA has 10-15% mid stenosis.  PDA and PLV branches were patent.  The patient tolerated the procedure well.  There were no complications.  The patient was transferred to recovery room in stable condition.     Eduardo Osier. Sharyn Lull, M.D.     MNH/MEDQ  D:  07/15/2010  T:  07/15/2010  Job:  [id number]  Electronically Signed by Rinaldo Cloud M.D. on 08/11/2010 11:09:32 PM

## 2010-08-12 NOTE — Consult Note (Signed)
NAME:  Kimberly Gaines, MCCUTCHEON NO.:  [id number]  MEDICAL RECORD NO.:  [id number]           PATIENT TYPE:  LOCATION:                                 FACILITY:  PHYSICIAN:  Vonna Kotyk R. Jacinto Halim, MD       DATE OF BIRTH:  [birth year]  DATE OF CONSULTATION: DATE OF DISCHARGE:                                CONSULTATION   REQUESTING PHYSICIAN:  Osvaldo Shipper. Spruill, MD  REASON FOR CONSULTATION:  Please evaluate for non-ST-elevation myocardial infarction.  HISTORY:  Ms. Kimberly Gaines is a very pleasant 77 year old female who lives at [facility] facility.  She has history of chronic anemia, hyperthyroidism, and hypertension.  She was admitted to the hospital on July 06, 2010, with complaints of nausea and vomiting for the last 3 weeks.  The patient states that she has been having some chest pain that occurred about 2 weeks ago, lasting about several 30 minutes.  It is located in the retrosternal region.  There was no radiation.  There was no associated nausea, vomiting, diaphoresis, or sweating with this chest discomfort.  This spontaneously subsided.  She also had another episode of chest discomfort last night, again states that it lasted only a few minutes, was not severe, not associated with nausea, vomiting, or diaphoresis, or shortness of breath at. Again, it spontaneously subsided.  The past nausea and vomiting that she has been having for the last 3 weeks is not associated with a chest discomfort.  The patient states that overall she is feeling better, but she still continues to be very extremely nauseous.  REVIEW OF SYSTEMS:  She denies any recent weight gain or weight loss, although per history she has been having lack of appetite and she is extremely worried about gaining weight and has not been eating much in the assisted living facility.  She is not a diabetic.  There is no significant bowel or bladder disturbances except for nausea that  been going on for the past 3 weeks.  There is no GI bleed of black stools by history. No history of syncope.  No history of hemoptysis.  Other systems are negative.  Her home medications included: 1. Multivitamin 1 p.o. daily. 2. Maxzide 37.5/25 one p.o. q.a.m. 3. Diovan 320 mg p.o. daily. 4. Prednisone 5 mg p.o. day. 5. Dexamethasone 4 mg p.o. daily p.r.n. 6. Coreg 12.5 mg p.o. b.i.d. 7. Alendronate 70 mg p.o. q.1 week. 8. Synthroid 50 mcg p.o. daily. 9. Aspirin 81 mg p.o. daily. 10.Digoxin 250 mcg p.o. daily.  ALLERGIES:  No known drug allergies.  PAST MEDICAL HISTORY:  Significant for hypertension, CHF, hypothyroidism, PFO, history of breast cancer in the remote past.  SOCIAL HISTORY:  She lives in [facility].  She is a Runner, broadcasting/film/video.  She does not drink.  She has never used tobacco products in her life.  FAMILY HISTORY:  There is no history of premature coronary artery disease in family, no history of diabetes in family.  PHYSICAL EXAMINATION:  GENERAL:  She is moderately built and wasted. She appears to be in no acute distress. VITAL SIGNS:  Temperature of 99.0 degrees Fahrenheit, heart rate  is 46 beats per minute and irregular, respiration was 14, blood pressure 114/58 mmHg. CARDIAC:  Forcible PMI.  There is 1-2/6 early systolic murmur heard in the aortic area. CHEST:  Clear. ABDOMEN:  Soft, nontender without any edema. VASCULAR:  Normal, except for mild soft right carotid bruit.  There was no prominent abdominal aneurysm that could be felt in a petite lady. There was no bruit.  She has excellent pulses.  Her EKG demonstrates sinus rhythm with poor R-wave progression and borderline low-voltage complexes without any evidence of ischemia.  QT interval was normal.  Her telemetry shows sinus bradycardia with first-degree AV block with frequent PACs and PVCs.  Her hemoglobin is low at 8.4 with a hematocrit of 26.2.  Her first set of cardiac markers point  of care, CK-MB was 6.2 with a troponin of 0.12 and second set of CK was 50 with a CK-MB of 7.0.  Troponin is mildly elevated at 0.17.  Her digoxin level was 2.8.  IMPRESSION: 1. Non-ST-elevation myocardial infarction. 2. Nausea and vomiting.  I suspect digitoxicity as her etiology for     her nausea and vomiting.  However, erosive gastritis with gastric     ulcers, peptic ulcer disease could not be completely excluded given     her severe anemia.  She does not have microcytic indices. 3. Frequent premature atrial contractions and premature ventricular     contractions and sinus bradycardia with first-degree     atrioventricular block.  RECOMMENDATIONS:  Given her advanced age, severe anemia with renal failure with a creatinine of 1.4 and a GFR of 37 pounds and advanced age, I would recommend medical therapy only.  I will stop her diuretics and digoxin, continue with aspirin, consider giving Plavix if there is no GI bleed or any obvious source of bleed. We will also add Imdur.  We will follow up with the CK-MB.  Unless recurrent chest discomfort which is intractable and unable to be controlled with medical therapy or if the cardiac enzymes are significantly elevated, we would recommend medical therapy only.  I extremely worry about contrast nephropathy, bleeding complications, and also cardioembolic complication during cardiac catheterization in this ultra elderly female.  I would recommend checking echocardiogram to evaluate for wall motion abnormality.  I thank you for asking me to give my opinion on this patient.  If you have any further questions, please do not hesitate to contact me.     Cristy Hilts. Jacinto Halim, MD     JRG/MEDQ  D:  07/07/2010  T:  07/07/2010  Job:  [id number]  cc:   Osvaldo Shipper. Spruill, M.D.  Electronically Signed by Yates Decamp MD on 08/12/2010 01:23:25 PM

## 2010-09-02 NOTE — Discharge Summary (Signed)
  NAME:  Kimberly Gaines, SERMONS NO.:  [id number]  MEDICAL RECORD NO.:  [id number]          PATIENT TYPE:  INP  LOCATION:  6729                         FACILITY:  [facility]  PHYSICIAN:  Osvaldo Shipper. Dorianne Perret, M.D.DATE OF BIRTH:  1920-06-17  DATE OF ADMISSION:  07/06/2010 DATE OF DISCHARGE:  07/16/2010                        DISCHARGE SUMMARY - REFERRING   ADDENDUM  This patient's cholesterol is not severely elevated.  She had normal coronary arteries and no statin therapy was instituted at this time. The patient will have additional followup in the office but due to her age, I have elected not to add any additional medicines including a statin at the present time.     Osvaldo Shipper. Jaquasia Doscher, M.D.     JOS/MEDQ  D:  08/16/2010  T:  08/16/2010  Job:  [id number]  Electronically Signed by Donia Guiles M.D. on 09/01/2010 07:54:00 PM

## 2010-11-03 DIAGNOSIS — R350 Frequency of micturition: Secondary | ICD-10-CM

## 2010-11-03 HISTORY — DX: Frequency of micturition: R35.0

## 2010-11-09 NOTE — Discharge Summary (Signed)
NAME:  Kimberly Gaines, Kimberly Gaines NO.:  [id number]   MEDICAL RECORD NO.:  [id number]          PATIENT TYPE:  INP   LOCATION:  5039                         FACILITY:  [facility]   PHYSICIAN:  Myrtie Neither, MD      DATE OF BIRTH:  02-28-1920   DATE OF ADMISSION:  05/17/2007  DATE OF DISCHARGE:  05/21/2007                               DISCHARGE SUMMARY   ADMITTING DIAGNOSIS:  Degenerative arthropathy, right knee.   DISCHARGE DIAGNOSIS:  Degenerative arthropathy, right knee.   COMPLICATIONS:  None.   INFECTIONS:  None.   OPERATION:  Right total knee arthroplasty, done on May 17, 2007.   PERTINENT HISTORY:  This is an 77 year old female who has had previous  left total knee arthroplasty, returns on this admission for progressive  worsening pain, swelling, weakness and giving way of the right knee.  The patient has been treated with the use of a walker and  antiinflammatories and therapeutic injection.   PERTINENT PHYSICAL EXAM:  Pertinent physical exam of the right knee:  Genu valgum, crepitus medial and lateral compartments and patellofemoral  joint, +2 effusion.  Range of motion good, but limited.  Negative Homan  test.  Neurovascular status intact.   IMAGING:  X-ray reveals sclerosis, loss of joint space, medial and  lateral and osteophytes about the right knee.   HOSPITAL COURSE:  Patient had preop laboratory and preop medical  evaluation by Dr. Osvaldo Shipper. Spruill, and preop labs were CBC, EKG, chest  x-ray, UA, CMET, PT, PTT, platelet count.  Patient's laboratory studies  were stable enough to undergo surgery.  Patient underwent right total  knee arthroplasty, tolerated anesthesia quite well. Postop course was  fairly benign.  Start on pre- and postop IV antibiotics.  Use of CPM  postoperatively and prophylactic Coumadin.  Patient had PT, case  management consult and OT consultation.  Patient progressed well with  ambulation, partial weightbearing on the right side  with use of walker,  tolerated CPM quite well.   Patient is able to be discharged, to continue ambulation with the  walker, weightbearing as tolerated.  Daily dressing changes to the right  knee.  She continued on Feosol 325 mg b.i.d., Coumadin 6 mg daily,  weekly INRs, keeping the INR between 2 and 2.5, Percocet 1 to 2 tabs  (that is 5-mg) q.6h. p.r.n. for pain.  Return to the office in 1 week.  The patient is being discharged in stable and satisfactory condition.  The patient's other home medications are that of  digoxin 250 mcg daily, triamterene, hydrochlorothiazide 37.5/25 mg  daily, Diovan 320 mg daily, Fosamax 70 mg one weekly on Saturday and  Coreg CR 20 mg daily.  Continue physical therapy for range of motion,  right knee.  Weightbearing as tolerated, right knee.  Return to the  office in 1 week.      Myrtie Neither, MD  Electronically Signed     AC/MEDQ  D:  05/20/2007  T:  05/20/2007  Job:  [id number]

## 2010-11-09 NOTE — Op Note (Signed)
NAME:  Kimberly Gaines, Kimberly Gaines NO.:  [id number]   MEDICAL RECORD NO.:  [id number]          PATIENT TYPE:  INP   LOCATION:  5039                         FACILITY:  [facility]   PHYSICIAN:  Myrtie Neither, MD      DATE OF BIRTH:  Oct 17, 1919   DATE OF PROCEDURE:  05/17/2007  DATE OF DISCHARGE:                               OPERATIVE REPORT   PREOPERATIVE DIAGNOSIS:  Degenerative joint disease, left knee.   POSTOPERATIVE DIAGNOSIS:  Degenerative joint disease, left knee.   ANESTHESIA:  General.   PROCEDURE:  Left total knee arthroplasty, Biomet implant.   The patient was taken to the operating room after given adequate preop  medications and given general anesthesia and intubated.  The left knee  was prepped with DuraPrep and draped in a sterile manner.  Tourniquet  and Bovie used for hemostasis.  Anterior midline incision made over the  left knee going through skin and subcutaneous tissue with sharp and  blunt dissection made both medially and laterally about the capsule.  A  medial paramedian incision was made into the capsule extending from the  quadriceps down to the tibial tuberosity.  The patella was reflected  laterally.  The knee was taken into a flexed position.  Osteophytes  about the patella, tibia and femur were resected.  Soft tissue resection  was then done.  The tibial cutting jig was put in place with 10 mm of  resection from the tibial plateau surface.  Reaming down the femoral  canal was then done, followed by placement of the distal femoral cutting  jig.  Sizing of the femur was that of 60 mm, followed by placement of  the 60-mm cutting jig.  Anterior and posterior cuts were made and  chamfering cuts were made.  Other soft tissue about the fin was  resected.  The trial component was put in place and it was found to fit  very snug.  Next attention was turned to the tibia.  The tibia was  measured at 67 mm.  Appropriate cutting jig was put in place and  appropriate cuts were made.  The tibia and the femoral components for  trial components were put in place, full extension, full flexion was  obtained.  The 12 mm provided better medial and lateral stability.  Next  the patella was measured at 34 mm and appropriate cutting jig was put in  place and appropriate cut made.  With all three trial components put in  place, the knee was taken into full flexion, full extension, good medial  and lateral stability, good tracking of the patella without lateral  subluxation.  Copious irrigation was then done.  Methyl methacrylate was  mixed.  Tibia and patellar components were cemented.  Excess methyl  methacrylate was removed and the cement had set.  Range of motion was  then tested again in full extension, full flexion, good medial and  lateral stability and good tracking of the patella.  Then the final 12-  mm poly was put in place and locked in position.  Copious irrigation was  done.  Tourniquet was let down.  Hemostasis obtained.  Wound closure was  then done with  0 Vicryl for the fascia, 2-0 for the subcutaneous and skin staples for  the skin.  A bulky compressive dressing was applied, knee immobilizer  applied.  The patient tolerated the procedure quite well, went to the  recovery room in a stable and satisfactory condition.     Myrtie Neither, MD  Electronically Signed    AC/MEDQ  D:  05/17/2007  T:  05/17/2007  Job:  [id number]

## 2010-11-12 NOTE — Discharge Summary (Signed)
NAME:  Kimberly Gaines, Kimberly Gaines NO.:  [id number]   MEDICAL RECORD NO.:  [id number]          PATIENT TYPE:  INP   LOCATION:  5033                         FACILITY:  [facility]   PHYSICIAN:  Myrtie Neither, MD      DATE OF BIRTH:  1919-11-01   DATE OF ADMISSION:  10/17/2006  DATE OF DISCHARGE:  10/20/2006                               DISCHARGE SUMMARY   ADMISSION DIAGNOSIS:  Rheumatoid arthritis, right knee.   DISCHARGE DIAGNOSIS:  Rheumatoid arthritis, right knee.   INFECTIONS:  None.   OPERATION:  Right total knee arthroplasty done on October 17, 2006.   PERTINENT INFECTIONS:  None.   PERTINENT HISTORY:  This is an 77 year old whom I have been following in  the office for rheumatoid arthritis with progressive worsening in both  knees, with the right knee being worse in the left with stiffness, pain,  swelling, and increased disability.  The patient had been treated with a  therapeutic injection, antiinflammatory, pain medication, and misuse of  cane.   PERTINENT PHYSICAL:  RIGHT KNEE:  +2 effusion, genu valgum, crepitus  medial lateral compartment and patellofemoral joint.  Range of motion  good but limited.  Neurovascular status intact.  Negative Homan test.   HOSPITAL COURSE:  The patient had preop medical evaluation.  He was  found to be stable enough to undergo surgery.  The patient had preop  laboratory, CBC, EKG, chest x-ray, CMET, UA, PT, PTT, and platelet  count, which were all within normal limits.  The patient underwent right  total knee arthroplasty, tolerated the procedure quite well.  Postoperative course was fairly benign.  The patient does have acute  blood loss anemia, secondary to postoperative bleeding.  The patient  progressed with therapy with partial weightbearing on the right side,  use of CPM.  The patient did have pre and postop IV antibiotics,  Coumadin therapy, and was seen by PT, OT, and case management.   DISCHARGE PLAN:  The patient is being  discharged today, and we will  continue with Percocet one to two every 4 p.r.n. pain, Coumadin 3 mg  daily, weekly INRs.  The patient is being discharged to [facility] to continue partial weightbearing on the right side, daily  dressing changes.  She is to continue on her previous home medications:  Triamterene/hydrochlorothiazide 37.5/25 one half tablet daily, aspirin  81 mg p.o. daily, discharge her  Celebrex for now, Coreg CR 20 mg daily, Darvocet one every 4 p.r.n. for  pain, Digitek 250 mcg daily, Centrum Silver vitamin daily, Diovan 320 mg  daily, __________ 70 mg Tuesdays.  The patient is being discharged in  stable and satisfactory condition.  The patient will be seen back in the  office in a now week period.      Myrtie Neither, MD  Electronically Signed     AC/MEDQ  D:  10/20/2006  T:  10/20/2006  Job:  [id number]

## 2010-11-12 NOTE — Op Note (Signed)
NAME:  Kimberly Gaines, Kimberly Gaines NO.:  [id number]   MEDICAL RECORD NO.:  [id number]          PATIENT TYPE:  INP   LOCATION:  2899                         FACILITY:  [facility]   PHYSICIAN:  Myrtie Neither, MD      DATE OF BIRTH:  1919/07/28   DATE OF PROCEDURE:  DATE OF DISCHARGE:  10/17/2006                               OPERATIVE REPORT   PREOPERATIVE DIAGNOSES:  Rheumatoid arthritis, right knee.   POSTOPERATIVE DIAGNOSIS:  Rheumatoid arthritis, right knee.   ANESTHESIA:  General.   OPERATION/PROCEDURE:  Right total knee arthroplasty with Biomet implant.   DESCRIPTION OF PROCEDURE:  The patient was takes to the operating room  after given adequate preoperative medication, given general anesthesia  and intubated.  Right knee was prepped with DuraPrep and draped in  sterile manner.  Tourniquet and Bovie used for hemostasis.  Anterior  midline incision made over the right knee going through skin and  subcutaneous tissue.  Sharp and blunt dissection made about the medial  and lateral capsule.  A medial paramedian incision made into the capsule  extending from the quadriceps down to the tibial tuberosity, patella  reflected laterally.  Osteophytes about the femur and tibia and patella  were resected.  Soft tissue release was also done after adequate soft  tissue resection.  The tibial cutting jig was put in place at 10 mm.  Tibial plateau surfaces were resected followed by reaming down the  femoral canal and distal femoral cutting jig was put in place with 6  degrees of valgus.  Distal femoral cut was made.  Sizing was 62.5 for  the femur.  Appropriate cutting jig was put in place and anterior  posterior cuts as well as chamfer cuts were done.  Trial femoral  component was put in place and found to fit quite well.  Next, attention  was turned back to the tibia which was sized at 75 mm.  Appropriate  cutting jigs were put in place and appropriate cuts were made.  Tibial  and  femoral trial components were put in place.  Range of motion, full  extension, full flexion, good medial and lateral stability, no  subluxation of the patella.  This was possible with the 14 mm poly.  Sizing of the patella was done with some size large 37 mm and  appropriate cuts were made.  With all three trial components in place,  the patient had full extension, full flexion, good medial and lateral  stability.  No subluxation of the patella, good tracking.  Copious  irrigation was done, trial implants were removed.  Irrigation with the  use of Simpulse irrigator was done.  Methyl methacrylate was mixed.  The  patella and tibial components were cemented.  The femoral component was  press fitted.  After excess with methyl methacrylate was removed and the  cement had set, final 14 mm poly tibial component was put in place and  locked with a key.  Again range of motion was full extension, full  flexion, good medial and lateral stability without lateral subluxation  of the patella.  Hemostasis obtained with the Bovie.  Wound  closure was  then done with 0 Vicryl  for the fascia, 2-0 for subcutaneous and skin  staples for the skin.  Bulky compressive dressing was applied.  Knee  immobilizer applied.  The patient tolerated procedure quite well and  went to the recovery room in stable and satisfactory condition.      Myrtie Neither, MD  Electronically Signed    AC/MEDQ  D:  10/17/2006  T:  10/17/2006  Job:  [id number]

## 2010-11-26 ENCOUNTER — Other Ambulatory Visit: Payer: Self-pay | Admitting: Internal Medicine

## 2010-11-26 DIAGNOSIS — Z1231 Encounter for screening mammogram for malignant neoplasm of breast: Secondary | ICD-10-CM

## 2010-12-27 ENCOUNTER — Ambulatory Visit (HOSPITAL_COMMUNITY)
Admission: RE | Admit: 2010-12-27 | Discharge: 2010-12-27 | Disposition: A | Discharge status: Home / Self Care | Payer: Medicare Other | Source: Ambulatory Visit | Attending: Internal Medicine | Admitting: Internal Medicine

## 2010-12-27 DIAGNOSIS — Z1231 Encounter for screening mammogram for malignant neoplasm of breast: Secondary | ICD-10-CM | POA: Insufficient documentation

## 2011-02-07 DIAGNOSIS — D638 Anemia in other chronic diseases classified elsewhere: Secondary | ICD-10-CM

## 2011-02-07 HISTORY — DX: Anemia in other chronic diseases classified elsewhere: D63.8

## 2011-03-09 DIAGNOSIS — H6120 Impacted cerumen, unspecified ear: Secondary | ICD-10-CM

## 2011-03-09 HISTORY — DX: Impacted cerumen, unspecified ear: H61.20

## 2011-04-05 LAB — CBC
HCT: 33.3 — ABNORMAL LOW
Hemoglobin: 8.4 — ABNORMAL LOW
MCHC: 33
MCHC: 33.3
MCV: 84.8
Platelets: 316
Platelets: 410 — ABNORMAL HIGH
RBC: 3.15 — ABNORMAL LOW
RDW: 14.2
RDW: 14.3
WBC: 9.8

## 2011-04-05 LAB — PROTIME-INR
INR: 1.1
INR: 1.1
INR: 1.3
INR: 1.7 — ABNORMAL HIGH
Prothrombin Time: 14.8
Prothrombin Time: 16 — ABNORMAL HIGH
Prothrombin Time: 17.1 — ABNORMAL HIGH

## 2011-04-05 LAB — COMPREHENSIVE METABOLIC PANEL
Albumin: 3.5
BUN: 14
Calcium: 9.2
Creatinine, Ser: 1.07
Total Protein: 7.3

## 2011-04-05 LAB — APTT: aPTT: 31

## 2011-04-05 LAB — URINALYSIS, ROUTINE W REFLEX MICROSCOPIC
Nitrite: NEGATIVE
Protein, ur: NEGATIVE
Specific Gravity, Urine: 1.006
Urobilinogen, UA: 0.2

## 2011-04-05 LAB — TYPE AND SCREEN

## 2011-06-28 DIAGNOSIS — M069 Rheumatoid arthritis, unspecified: Secondary | ICD-10-CM

## 2011-06-28 DIAGNOSIS — K2100 Gastro-esophageal reflux disease with esophagitis, without bleeding: Secondary | ICD-10-CM

## 2011-06-28 DIAGNOSIS — K5900 Constipation, unspecified: Secondary | ICD-10-CM

## 2011-06-28 HISTORY — DX: Constipation, unspecified: K59.00

## 2011-06-28 HISTORY — DX: Gastro-esophageal reflux disease with esophagitis, without bleeding: K21.00

## 2011-06-28 HISTORY — DX: Rheumatoid arthritis, unspecified: M06.9

## 2011-12-06 ENCOUNTER — Other Ambulatory Visit: Payer: Self-pay | Admitting: Internal Medicine

## 2011-12-06 DIAGNOSIS — Z1231 Encounter for screening mammogram for malignant neoplasm of breast: Secondary | ICD-10-CM

## 2011-12-26 DIAGNOSIS — M25539 Pain in unspecified wrist: Secondary | ICD-10-CM

## 2011-12-26 HISTORY — DX: Pain in unspecified wrist: M25.539

## 2011-12-30 ENCOUNTER — Ambulatory Visit (HOSPITAL_COMMUNITY)
Admission: RE | Admit: 2011-12-30 | Discharge: 2011-12-30 | Disposition: A | Discharge status: Home / Self Care | Payer: Medicare Other | Source: Ambulatory Visit | Attending: Internal Medicine | Admitting: Internal Medicine

## 2011-12-30 DIAGNOSIS — Z1231 Encounter for screening mammogram for malignant neoplasm of breast: Secondary | ICD-10-CM

## 2012-01-01 IMAGING — CR DG CHEST 1V PORT
1 series · 1 of 1 positions shown · non-contrast
Comparison: Portable exam 7669 hours compared to 06/29/2010

CLINICAL DATA: Nausea, vomiting, weakness, near-syncope, history
hypertension, heart disease

PORTABLE CHEST - 1 VIEW

[view not recorded]
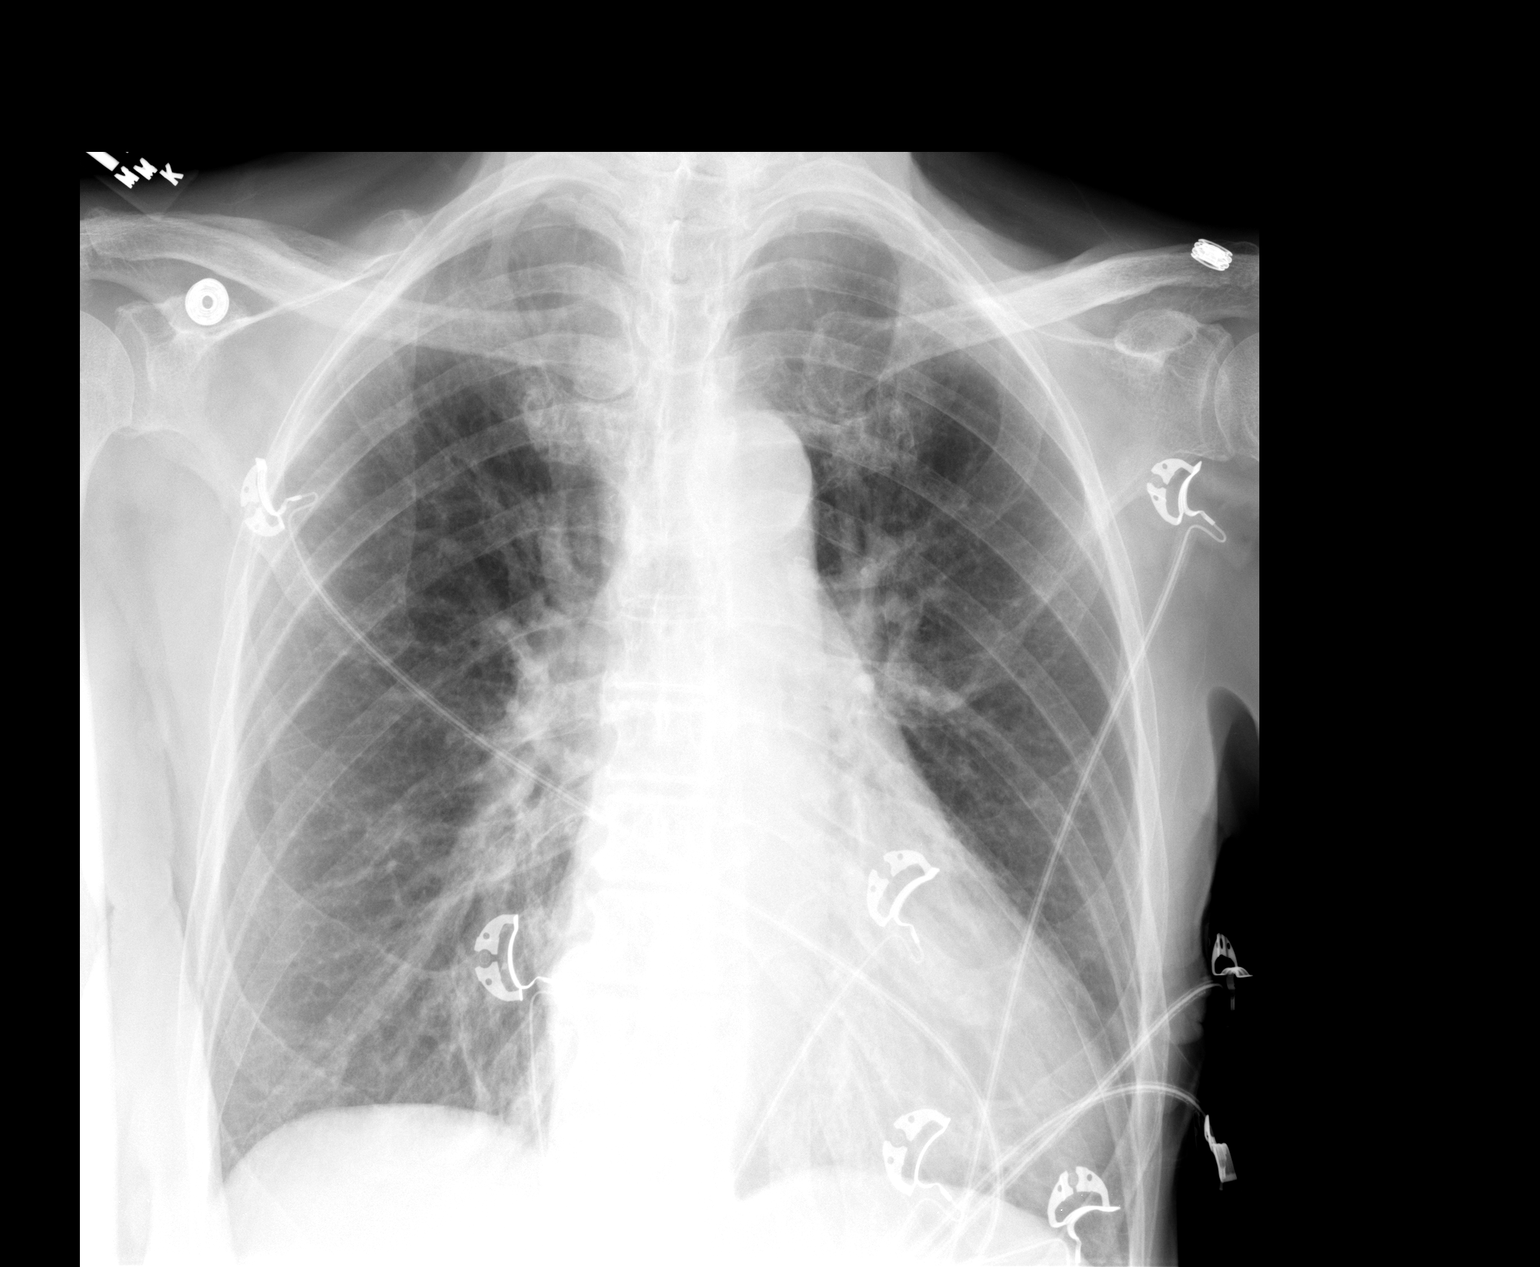

[1 of 1 positions shown; findings below may reference images not displayed]

FINDINGS: Enlargement of cardiac silhouette.
Mediastinal contours normal.
Slight pulmonary vascular congestion.
Minimal atelectasis right lung base.
No gross failure or consolidation.
No pneumothorax.
Skin fold projects over right chest.
Bones appear diffusely demineralized.
IMPRESSION: Enlargement of cardiac silhouette with pulmonary vascular
congestion.
Minimal atelectasis right lung base.

## 2012-06-18 DIAGNOSIS — N390 Urinary tract infection, site not specified: Secondary | ICD-10-CM

## 2012-06-18 HISTORY — DX: Urinary tract infection, site not specified: N39.0

## 2012-06-27 DIAGNOSIS — N952 Postmenopausal atrophic vaginitis: Secondary | ICD-10-CM

## 2012-06-27 HISTORY — DX: Postmenopausal atrophic vaginitis: N95.2

## 2012-07-02 DIAGNOSIS — R300 Dysuria: Secondary | ICD-10-CM

## 2012-07-02 HISTORY — DX: Dysuria: R30.0

## 2012-08-06 DIAGNOSIS — M25569 Pain in unspecified knee: Secondary | ICD-10-CM

## 2012-08-06 HISTORY — DX: Pain in unspecified knee: M25.569

## 2012-08-25 DIAGNOSIS — H259 Unspecified age-related cataract: Secondary | ICD-10-CM | POA: Insufficient documentation

## 2012-08-25 HISTORY — DX: Unspecified age-related cataract: H25.9

## 2012-09-28 ENCOUNTER — Encounter (HOSPITAL_COMMUNITY): Payer: Self-pay | Admitting: Emergency Medicine

## 2012-09-28 ENCOUNTER — Emergency Department (HOSPITAL_COMMUNITY)
Admission: EM | Admit: 2012-09-28 | Discharge: 2012-09-29 | Disposition: A | Discharge status: Rehabilitation Facility | Payer: Medicare Other | Attending: Emergency Medicine | Admitting: Emergency Medicine

## 2012-09-28 ENCOUNTER — Emergency Department (HOSPITAL_COMMUNITY): Payer: Medicare Other

## 2012-09-28 ENCOUNTER — Other Ambulatory Visit: Payer: Self-pay

## 2012-09-28 DIAGNOSIS — Z862 Personal history of diseases of the blood and blood-forming organs and certain disorders involving the immune mechanism: Secondary | ICD-10-CM | POA: Insufficient documentation

## 2012-09-28 DIAGNOSIS — E039 Hypothyroidism, unspecified: Secondary | ICD-10-CM | POA: Insufficient documentation

## 2012-09-28 DIAGNOSIS — K2100 Gastro-esophageal reflux disease with esophagitis, without bleeding: Secondary | ICD-10-CM | POA: Insufficient documentation

## 2012-09-28 DIAGNOSIS — Y9289 Other specified places as the place of occurrence of the external cause: Secondary | ICD-10-CM | POA: Insufficient documentation

## 2012-09-28 DIAGNOSIS — M810 Age-related osteoporosis without current pathological fracture: Secondary | ICD-10-CM | POA: Insufficient documentation

## 2012-09-28 DIAGNOSIS — H259 Unspecified age-related cataract: Secondary | ICD-10-CM | POA: Insufficient documentation

## 2012-09-28 DIAGNOSIS — Y9389 Activity, other specified: Secondary | ICD-10-CM | POA: Insufficient documentation

## 2012-09-28 DIAGNOSIS — I441 Atrioventricular block, second degree: Secondary | ICD-10-CM | POA: Insufficient documentation

## 2012-09-28 DIAGNOSIS — I10 Essential (primary) hypertension: Secondary | ICD-10-CM | POA: Insufficient documentation

## 2012-09-28 DIAGNOSIS — Z8679 Personal history of other diseases of the circulatory system: Secondary | ICD-10-CM | POA: Insufficient documentation

## 2012-09-28 DIAGNOSIS — Z79899 Other long term (current) drug therapy: Secondary | ICD-10-CM | POA: Insufficient documentation

## 2012-09-28 DIAGNOSIS — Z8739 Personal history of other diseases of the musculoskeletal system and connective tissue: Secondary | ICD-10-CM | POA: Insufficient documentation

## 2012-09-28 DIAGNOSIS — S42293A Other displaced fracture of upper end of unspecified humerus, initial encounter for closed fracture: Secondary | ICD-10-CM | POA: Insufficient documentation

## 2012-09-28 DIAGNOSIS — Z853 Personal history of malignant neoplasm of breast: Secondary | ICD-10-CM | POA: Insufficient documentation

## 2012-09-28 DIAGNOSIS — S42202A Unspecified fracture of upper end of left humerus, initial encounter for closed fracture: Secondary | ICD-10-CM

## 2012-09-28 DIAGNOSIS — R296 Repeated falls: Secondary | ICD-10-CM | POA: Insufficient documentation

## 2012-09-28 DIAGNOSIS — K219 Gastro-esophageal reflux disease without esophagitis: Secondary | ICD-10-CM | POA: Insufficient documentation

## 2012-09-28 DIAGNOSIS — K5900 Constipation, unspecified: Secondary | ICD-10-CM | POA: Insufficient documentation

## 2012-09-28 HISTORY — DX: Unspecified osteoarthritis, unspecified site: M19.90

## 2012-09-28 LAB — BASIC METABOLIC PANEL
BUN: 30 mg/dL — ABNORMAL HIGH (ref 6–23)
CO2: 23 mEq/L (ref 19–32)
Calcium: 8.5 mg/dL (ref 8.4–10.5)
Chloride: 101 mEq/L (ref 96–112)
Creatinine, Ser: 1.29 mg/dL — ABNORMAL HIGH (ref 0.50–1.10)
Glucose, Bld: 127 mg/dL — ABNORMAL HIGH (ref 70–99)

## 2012-09-28 LAB — CBC
HCT: 38.9 % (ref 36.0–46.0)
Hemoglobin: 12.4 g/dL (ref 12.0–15.0)
MCH: 29.6 pg (ref 26.0–34.0)
MCV: 92.8 fL (ref 78.0–100.0)
Platelets: 151 10*3/uL (ref 150–400)
RBC: 4.19 MIL/uL (ref 3.87–5.11)
WBC: 11.5 10*3/uL — ABNORMAL HIGH (ref 4.0–10.5)

## 2012-09-28 LAB — APTT: aPTT: 23 seconds — ABNORMAL LOW (ref 24–37)

## 2012-09-28 MED ORDER — OXYCODONE-ACETAMINOPHEN 5-325 MG PO TABS
2.0000 | ORAL_TABLET | Freq: Once | ORAL | Status: AC
  Administered 2012-09-28: 2 via ORAL
  Filled 2012-09-28: qty 2

## 2012-09-28 MED ORDER — HYDROCODONE-ACETAMINOPHEN 5-325 MG PO TABS: 2.0000 | ORAL_TABLET | ORAL | Status: DC | PRN

## 2012-09-28 NOTE — ED Notes (Addendum)
Called [facility] [phone]) to find out about placement for pt for tonight per Dr. Eber Hong.  Spoke with Hessie Diener, RN at [facility]. Stated that they do have space for the pt. Admission is confirmed. Pt will be in [address]. Dr. Eulah Citizen will be the receiving and admitting physician at [facility].

## 2012-09-28 NOTE — ED Notes (Signed)
Per EMS, pt was walking into her bathroom, lost her balance, and fell onto her left side with no LOC. Pain in left upper arm with deformity and crepitus. Also, small hematoma to the left occipital. Pt denies pain in neck or back. No blood thinners. Pt A&O x 4.  100 mcg Fentanyl given enroute. PIV 20g RAC.

## 2012-09-28 NOTE — ED Notes (Signed)
ZOX:[id number]<AV> Expected date:09/28/12<BR> Expected time: 8:48 PM<BR> Means of arrival:Ambulance<BR> Comments:<BR> RM 20: Fall, L arm injury

## 2012-09-28 NOTE — ED Provider Notes (Signed)
History     CSN: [id number]  Arrival date & time 09/28/12  2101   First MD Initiated Contact with Patient 09/28/12 2134      Chief Complaint  Patient presents with  . Fall    (Consider location/radiation/quality/duration/timing/severity/associated sxs/prior treatment) HPI Comments: 79 year old female with a history of rheumatoid arthritis who presents with a complaint of left arm pain after a fall. She states that she lost her balance while she was in her bathroom, fell onto her left upper extremity to the ground and had acute onset of pain associated with deformity. This is worse with palpation and range of motion, not associated with numbness or weakness of the left upper extremity below the shoulder. She has an associated hematoma to the back of her head but denies neck pain, loss of consciousness. She was unable to ambulate at the scene, family member called [phone] and the patient was advised to stay on the ground. She arrives immobilized with normal mental status and able answer my questions appropriately. She does have a history of prednisone use related to her rheumatoid arthritis  Patient is a 79 y.o. female presenting with fall. The history is provided by the patient, a relative and the EMS personnel.  Fall    Past Medical History  Diagnosis Date  . Arthritis     Past Surgical History  Procedure Laterality Date  . Eye surgery      History reviewed. No pertinent family history.  History  Substance Use Topics  . Smoking status: Never Smoker   . Smokeless tobacco: Not on file  . Alcohol Use: Yes     Comment: occ    OB History   Grav Para Term Preterm Abortions TAB SAB Ect Mult Living                  Review of Systems  All other systems reviewed and are negative.    Allergies  Aleve  Home Medications   Current Outpatient Rx  Name  Route  Sig  Dispense  Refill  . Bromfenac Sodium (PROLENSA OP)   Left Eye   Place 1 drop into the left eye daily.          . carvedilol (COREG) 3.125 MG tablet   Oral   Take 3.125 mg by mouth 2 (two) times daily with a meal.         . docusate sodium (COLACE) 100 MG capsule   Oral   Take 100 mg by mouth daily.         Marland Kitchen gatifloxacin (ZYMAXID) 0.5 % SOLN   Left Eye   Place 1 drop into the left eye 4 (four) times daily.         Marland Kitchen levothyroxine (SYNTHROID, LEVOTHROID) 100 MCG tablet   Oral   Take 100 mcg by mouth daily before breakfast.         . losartan (COZAAR) 50 MG tablet   Oral   Take 50 mg by mouth daily.         . Multiple Vitamin (MULTIVITAMIN WITH MINERALS) TABS   Oral   Take 1 tablet by mouth daily.         . pantoprazole (PROTONIX) 40 MG tablet   Oral   Take 40 mg by mouth daily.         . prednisoLONE acetate (PRED FORTE) 1 % ophthalmic suspension   Left Eye   Place 1 drop into the left eye 4 (four) times daily.         Marland Kitchen  PRESCRIPTION MEDICATION      "Pills for cataract surgery."         . PRESCRIPTION MEDICATION      "Eye drops for cataract surgery."         . spironolactone (ALDACTONE) 25 MG tablet   Oral   Take 25 mg by mouth daily.         Marland Kitchen HYDROcodone-acetaminophen (NORCO/VICODIN) 5-325 MG per tablet   Oral   Take 2 tablets by mouth every 4 (four) hours as needed for pain.   10 tablet   0     BP 161/100  Pulse 52  Temp(Src) 97.4 F (36.3 C) (Oral)  Resp 16  SpO2 97%  Physical Exam  Nursing note and vitals reviewed. Constitutional: She appears well-developed and well-nourished. No distress.  HENT:  Head: Normocephalic.  Mouth/Throat: Oropharynx is clear and moist. No oropharyngeal exudate.  Hematoma to the L upper crown of the head  Eyes: Conjunctivae and EOM are normal. Pupils are equal, round, and reactive to light. Right eye exhibits no discharge. Left eye exhibits no discharge. No scleral icterus.  Neck: Normal range of motion. Neck supple. No JVD present. No thyromegaly present.  Cardiovascular: Normal heart sounds and intact  distal pulses.  Exam reveals no gallop and no friction rub.   No murmur heard. Tachycardia occasional ectopy   Pulmonary/Chest: Effort normal and breath sounds normal. No respiratory distress. She has no wheezes. She has no rales.  Abdominal: Soft. Bowel sounds are normal. She exhibits no distension and no mass. There is no tenderness.  Musculoskeletal: Normal range of motion. She exhibits tenderness ( focal ttp and swelling to the proximal L humerus.  normal ROM of the L elbow and all other joints other than L shoulder - has crepiance in this area.). She exhibits no edema.  Lymphadenopathy:    She has no cervical adenopathy.  Neurological: She is alert. Coordination normal.  Skin: Skin is warm and dry. No rash noted. No erythema.  Psychiatric: She has a normal mood and affect. Her behavior is normal.    ED Course  Procedures (including critical care time)  Labs Reviewed  CBC - Abnormal; Notable for the following:    WBC 11.5 (*)    All other components within normal limits  BASIC METABOLIC PANEL - Abnormal; Notable for the following:    Potassium 3.4 (*)    Glucose, Bld 127 (*)    BUN 30 (*)    Creatinine, Ser 1.29 (*)    GFR calc non Af Amer 35 (*)    GFR calc Af Amer 41 (*)    All other components within normal limits  APTT - Abnormal; Notable for the following:    aPTT 23 (*)    All other components within normal limits  PROTIME-INR   Dg Elbow Complete Left  09/28/2012  *RADIOLOGY REPORT*  Clinical Data: Fall with left elbow pain.  LEFT ELBOW - COMPLETE 3+ VIEW  Comparison: None.  Findings: The lateral view demonstrates evidence of a joint effusion with elevation of the anterior fat pad.  A definite fracture is not identified.  Subtle occult fracture is potentially present given the joint effusion.  There is no evidence of dislocation, significant degenerative disease or bony lesions.  IMPRESSION: Elbow joint effusion without definite fracture identified.   Original Report  Authenticated By: Irish Lack, M.D.    Dg Shoulder Left  09/28/2012  *RADIOLOGY REPORT*  Clinical Data: Left upper arm pain after fall.  LEFT SHOULDER -  2+ VIEW  Comparison: None.  Findings: Mostly transverse comminuted fracture of the left humeral shaft with impaction of fracture fragments and with superior and medial displacement of the distal fracture fragment.  Displaced bone fragment inferior to the glenoid may represent a fragment off of the glenoid or the humerus.  There is some cortical irregularity in the glenoid suggesting involvement.  Degenerative changes in the shoulder involving the acromioclavicular and glenohumeral joints with large sub acromial spur formation.  Calcification of the sub acromial space suggesting underlying calcific tendonitis.  IMPRESSION: Displaced fractures of the left humeral neck with probable involvement of the inferior glenoid.  Underlying degenerative changes and calcific tendonitis.   Original Report Authenticated By: Burman Nieves, M.D.      1. Fracture of proximal humerus, left, closed, initial encounter       MDM  The patient has physical exam findings consistent with rheumatoid arthritis, has a history of prednisone use which predisposes her to fractures, I suspect that she has a fracture of her left proximal humerus, x-ray of the elbow shows no signs of acute fracture, ice, elevation, immobilization. Will also check EKG to see if there is atrial fibrillation is on clinical exam the patient has a tachycardic rate with irregular rhythm. Though likely ectopy.  ED ECG REPORT  I personally interpreted this EKG   Date: 09/28/2012   Rate: 85  Rhythm: normal sinus rhythm  QRS Axis: left  Intervals: normal  ST/T Wave abnormalities: nonspecific T wave changes  Conduction Disutrbances:none  Narrative Interpretation: LVH  Old EKG Reviewed: Compared with 07/15/2010, left ventricular hypertrophy still present  Pt will need higher level of care -- sling  placed, pain meds given with improvement, pt informed of the results, agreeable to plan.  Change of shift - care signed out to Dr. Dierdre Highman pending CT reads and placement.       Vida Roller, MD 09/28/12 2352

## 2012-09-29 DIAGNOSIS — S42309A Unspecified fracture of shaft of humerus, unspecified arm, initial encounter for closed fracture: Secondary | ICD-10-CM | POA: Insufficient documentation

## 2012-09-29 HISTORY — DX: Unspecified fracture of shaft of humerus, unspecified arm, initial encounter for closed fracture: S42.309A

## 2012-09-29 NOTE — ED Notes (Signed)
Unable to transport pt. [facility] called for transport.

## 2012-10-01 ENCOUNTER — Non-Acute Institutional Stay (SKILLED_NURSING_FACILITY): Payer: Medicare Other | Admitting: Geriatric Medicine

## 2012-10-01 ENCOUNTER — Other Ambulatory Visit: Payer: Self-pay | Admitting: *Deleted

## 2012-10-01 ENCOUNTER — Encounter: Payer: Self-pay | Admitting: Geriatric Medicine

## 2012-10-01 DIAGNOSIS — S42209A Unspecified fracture of upper end of unspecified humerus, initial encounter for closed fracture: Secondary | ICD-10-CM | POA: Insufficient documentation

## 2012-10-01 MED ORDER — HYDROCODONE-ACETAMINOPHEN 5-325 MG PO TABS: ORAL_TABLET | ORAL | Status: DC

## 2012-10-01 NOTE — Progress Notes (Signed)
Patient ID: Kimberly Gaines, female   DOB: [birth year], 79 y.o.   MRN: [id number] Chief Complaint  Patient presents with  . Humerus fracture   HPI This 79yo [place] resident of [name] was admitted to the Rehab section 09/29/12 after evaluation in the Ed at [facility]. Pt. reports she fell while walking in her apartment, does not recall any dizziness, H/A, CP, SOB or other symptoms prior to the fall. She had immediate left arm pain, instructed her son to call for help. ED evaluation included x-ray left shoulder, elbow as well as CT scan head/neck.  These investigations revealed  displaced fractures of the left humeral neck with probable involvement of the inferior glenoid. Sling was applied to left arm , pt was instructed to keep arm immobile and non weight bearing.   Allergies Reviewed   Medications Reviewed  Data Reviewed     09/29/12:  CT HEAD IMPRESSION: 1. Atrophy and small vessel disease. 2. No evidence for acute intracranial abnormality.  3. Left occipital scalp edema.  CT CERVICAL SPINE Findings: There is significant degenerative changes throughout  cervical spine. Bilateral foraminal narrowing is identified at C3-  4, C4-5, C5-6, and C6-7. There is no evidence for acute fracture  or subluxation. Spinal stenosis is noted C4-C6 secondary to  degenerative changes.  Lung apices are unremarkable.   LEFT SHOULDER - 2+ VIEW Findings: Mostly transverse comminuted fracture of the left humeral  shaft with impaction of fracture fragments and with superior and  medial displacement of the distal fracture fragment. Displaced  bone fragment inferior to the glenoid may represent a fragment off  of the glenoid or the humerus. There is some cortical irregularity  in the glenoid suggesting involvement. Degenerative changes in the  shoulder involving the acromioclavicular and glenohumeral joints  with large sub acromial spur formation. Calcification of the sub  acromial space  suggesting underlying calcific tendonitis.  IMPRESSION:  Displaced fractures of the left humeral neck with probable  involvement of the inferior glenoid. Underlying degenerative  changes and calcific tendonitis.  LEFT ELBOW - COMPLETE 3+ VIEW IMPRESSION: Elbow joint effusion without definite fracture identified.    WUJ:WJXBJYNW, external 02/09/2012 Iron 34, UIBC 189, TIBC 223  CBC: Rbc 3.93, Hgb 11.1, Hct 32.5, Platelet 418   Retic 1.1, RBC 3.86  BMP: glucose 93, BUN 20, Creatinine 1.06  BNP 59.8 07/12/2011 CBC: Wbc 8.2, Rbc 3.95, Hgb 10.8, Hct 35.0, Platelet 394 07/03/2012 CBC: Rbc 3.91, Hgb 10.9, Hct 33.8  Retic count 1.0  CMP:  BUN 29, creatinine 1.43, otherwise normal  Lipids: Total cholesterol 186, triglycerides 84, HDL 71, LDL 98  TSH 8.017  Rheumatoid factor 329 (normal less than 14)   Medication List       These changes are accurate as of: 10/01/2012  1:38 PM. If you have any questions, ask your nurse or doctor.          TAKE these medications       acetaminophen 500 MG tablet  Commonly known as:  TYLENOL  Take 500 mg by mouth 4 (four) times daily.     carboxymethylcellulose 0.5 % Soln  Commonly known as:  REFRESH PLUS  1 drop 3 (three) times daily as needed.     carvedilol 3.125 MG tablet  Commonly known as:  COREG  Take 3.125 mg by mouth 2 (two) times daily with a meal.     conjugated estrogens vaginal cream  Commonly known as:  PREMARIN  Place vaginally 2 (two)  times a week.     docusate sodium 100 MG capsule  Commonly known as:  COLACE  Take 100 mg by mouth daily.     gatifloxacin 0.5 % Soln  Commonly known as:  ZYMAXID  Place 1 drop into the left eye 4 (four) times daily.     HYDROcodone-acetaminophen 5-325 MG per tablet  Commonly known as:  NORCO/VICODIN  Take two tablets every 4 hours as needed pain  Changed by:  Kimberly Gaines, RMA     levothyroxine 100 MCG tablet  Commonly known as:  SYNTHROID, LEVOTHROID  Take 100 mcg by mouth daily before  breakfast.     losartan 50 MG tablet  Commonly known as:  COZAAR  Take 50 mg by mouth every morning.     multivitamin with minerals Tabs  Take 1 tablet by mouth every morning.     pantoprazole 40 MG tablet  Commonly known as:  PROTONIX  Take 40 mg by mouth every morning.     prednisoLONE acetate 1 % ophthalmic suspension  Commonly known as:  PRED FORTE  Place 1 drop into the left eye 4 (four) times daily.     predniSONE 10 MG tablet  Commonly known as:  DELTASONE  Take 20 mg by mouth daily.     PROLENSA OP  Place 1 drop into the left eye daily.     spironolactone 25 MG tablet  Commonly known as:  ALDACTONE  Take 25 mg by mouth every morning.          PMH  Past Medical History  Diagnosis Date  . Arthritis   . Iron deficiency anemia, unspecified   . Chronic diastolic heart failure 2012  . Unspecified constipation 2013  . 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 Edema 2012  . Reflux esophagitis 2013  . Unspecified essential hypertension 2012  . Unspecified hypothyroidism 2012  . Mobitz (type) II atrioventricular block 2012  . Osteoarthrosis, unspecified whether generalized or localized, lower leg 2008    s/p bilateral TKA  . Senile osteoporosis   . Personal history of malignant neoplasm of breast 1998    Left mastectomy  . Rheumatoid arthritis 2013  . Senile cataract, unspecified   . Postmenopausal atrophic vaginitis 2014     PSH  Past Surgical History  Procedure Laterality Date  . Eye surgery    . Appendectomy  1978  . Abdominal hysterectomy  1978  . Breast surgery    . Mastectomy Left 1998  . Cataract extraction w/ intraocular lens  implant, bilateral  2956,2130    Rt 2003, left 2014  . Joint replacement Bilateral     Knees   SH  History   Social History Narrative   Widow. Resides in Independent Living apartment at [name] since 2005. 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 No  smoking history, minimal alcohol, intake. Stopped driving several years ago.    Review of Systems   DATA OBTAINED: from patient, nurse, medical record, GENERAL: Feels well. No fevers, fatigue,. No change in appetite, weight SKIN: No itch, rash or open wounds EYES: No eye pain, dryness or itching. Recent cataract surgery left eye EARS: No earache, tinnitus, change in hearing NOSE: No congestion, drainage or bleeding MOUTH/THROAT: No mouth or tooth pain. No difficulty chewing or swallowing. RESPIRATORY: No cough, wheezing, SOB CARDIAC: No chest pain, palpitations. No edema. GI: No abdominal pain. No N/V/D or constipation. No heartburn or reflux. LAs  BM today GU: No dysuria, frequency or urgency. No change in urine volume or character MUSCULOSKELETAL: No joint pain, swelling or stiffness. No back pain. No muscle ache, pain, weakness. Gait is steady. No recent falls.  NEUROLOGIC: No dizziness, fainting, headache, imbalance, numbness. No change in mental status.  PSYCHIATRIC: No anxiety, depression, behavior issue. Sleeps well.   Physical Exam  Filed Vitals:   10/01/12 1251  BP: 121/71  Pulse: 65  Temp: 96.7 F (35.9 C)  Resp: 16    GENERAL APPEARANCE: No acute distress, appropriately groomed, normal body habitus. Alert, pleasant, conversant. HEAD: Normocephalic, atraumatic EYES: Conjunctiva/lids clear. Pupils round, reactive. EOMs intact.  EARS: External exam WNL, . Hearing grossly normal. NOSE: No deformity or discharge. MOUTH/THROAT: Lips w/o lesions. Oral mucosa, tongue moist, w/o lesion. Oropharynx w/o redness or lesions.  NECK: Supple, full ROM. No thyroid tenderness, enlargement or nodule LYMPHATICS: No head, neck or supraclavicular adenopathy RESPIRATORY: Breathing is even, unlabored. Lung sounds are clear and full.  CARDIOVASCULAR: Heart RRR. No murmur or extra heart sounds   EDEMA: No peripheral  GASTROINTESTINAL: Abdomen is soft, non-tender, not distended w/ normal bowel  sounds.  MUSCULOSKELETAL: Moves Rt UE, Bil LE FROM, strength and tone. Left arm in sling, brief exam- shoulder. Upper arm edematous, bruised, mildly tender. Left hand grip a bit weak, no hand or finger edema NEUROLOGIC: Oriented to time, place, person. Speech clear, no tremor.   PSYCHIATRIC: Mood and affect appropriate to situation  ASSESSMENT/PLAN  Closed fracture of unspecified part of upper end of humerus Left humerus fracture, possibly involving glenoid. For orthopedic evaluation this afternoon with Dr.A.Montez Morita. Since pt is limited in regards to ADLs, she will stay in Rehab [facility].    Follow up: AS needed   Blake Vetrano T.Navraj Dreibelbis, NP-C 10/01/2012

## 2012-10-01 NOTE — Assessment & Plan Note (Signed)
Left humerus fracture, possibly involving glenoid. For orthopedic evaluation this afternoon with Dr.A.Montez Morita. Since pt is limited in regards to ADLs, she will stay in Rehab [facility].

## 2012-10-18 ENCOUNTER — Non-Acute Institutional Stay (SKILLED_NURSING_FACILITY): Payer: Medicare Other | Admitting: Geriatric Medicine

## 2012-10-18 ENCOUNTER — Encounter: Payer: Self-pay | Admitting: Geriatric Medicine

## 2012-10-18 DIAGNOSIS — H259 Unspecified age-related cataract: Secondary | ICD-10-CM

## 2012-10-18 DIAGNOSIS — S42209A Unspecified fracture of upper end of unspecified humerus, initial encounter for closed fracture: Secondary | ICD-10-CM

## 2012-10-18 DIAGNOSIS — M1710 Unilateral primary osteoarthritis, unspecified knee: Secondary | ICD-10-CM | POA: Insufficient documentation

## 2012-10-18 DIAGNOSIS — N390 Urinary tract infection, site not specified: Secondary | ICD-10-CM

## 2012-10-18 DIAGNOSIS — M069 Rheumatoid arthritis, unspecified: Secondary | ICD-10-CM | POA: Insufficient documentation

## 2012-10-18 NOTE — Assessment & Plan Note (Signed)
Continues daily prednisone, pain in hands/ knees well managed. Gait is unsteady , requires walker AAT

## 2012-10-18 NOTE — Assessment & Plan Note (Signed)
Pt w/ urinary urgency, frequency/ incontinence. Urine culture returned positive for infection- Cipro started 10/14/12. ASymtomatic today, tolerating antibx without adverse effect.

## 2012-10-18 NOTE — Assessment & Plan Note (Signed)
Returned to Dr.Stoneburner 4/17, evaluation satisfactory, eyedrops adjusted. Will be tapered aff prednisolone over 3 weeks.

## 2012-10-18 NOTE — Assessment & Plan Note (Addendum)
To Dr Montez Morita 4/7,14. Remains NWB left UE. Added Ca/Vit D. OT interventions to reduce edema and preserve elbow/wrist ROM. Pt. Is ambulating with quad walker. Pain is well managed. Remains skilled level of care. RT to Dr.Carter as scheduled 10/29/12

## 2012-10-18 NOTE — Progress Notes (Signed)
Patient ID: Kimberly Gaines, female   DOB: Mar 12, 1920, 79 y.o.   MRN: [id number] Chief Complaint  Patient presents with  . F/u humerus fracture   HPI This 79yo female resident of [name] was admitted to the Rehab section 09/29/12 after evaluation in the ED at [facility]. Pt. reports she fell while walking in her apartment, does not recall any dizziness, H/A, CP, SOB or other symptoms prior to the fall. She had immediate left arm pain, instructed her son to call for help. ED evaluation included x-ray left shoulder, elbow as well as CT scan head/neck.  These investigations revealed  displaced fractures of the left humeral neck with probable involvement of the inferior glenoid. Sling was applied to left arm , pt was instructed to keep arm immobile and non weight bearing.  Patient has been evaluated by Dr. Myrtie Neither. He recommends conservative treatment for this fracture with shoulder immobilization in a sling. Patient is working with PT and OT to maintain her strength and learn alternate means for completing ADLs. Patient remains at skilled level of care due to limited left upper extremity mobility  Patient developed urinary tract symptoms last week, culture returned positive for infection 3. She is being treated with p.o. Cipro. Lastly patient followed up with her ophthalmologist, Dr. Dagoberto Ligas for followup after her cataract left cataract surgery in March. Evaluation was satisfactory. Patient received instructions regarding her eyedrops.  Allergies Reviewed   Medications Reviewed  Data Reviewed       WUJ:WJXBJYNW, external 02/09/2012 Iron 34, UIBC 189, TIBC 223  CBC: Rbc 3.93, Hgb 11.1, Hct 32.5, Platelet 418   Retic 1.1, RBC 3.86  BMP: glucose 93, BUN 20, Creatinine 1.06  BNP 59.8 07/12/2011 CBC: Wbc 8.2, Rbc 3.95, Hgb 10.8, Hct 35.0, Platelet 394 07/03/2012 CBC: Rbc 3.91, Hgb 10.9, Hct 33.8  Retic count 1.0  CMP:  BUN 29, creatinine 1.43, otherwise normal  Lipids:  Total cholesterol 186, triglycerides 84, HDL 71, LDL 98  TSH 8.017  Rheumatoid factor 329 (normal less than 14) 10/14/2012 urine culture in the 100,000 colonies Escherichia coli       Review of Systems   DATA OBTAINED: from patient, nurse, medical record, GENERAL: Feels well. No fevers, fatigue,. No change in appetite, weight SKIN: No itch, rash or open wounds EYES: No eye pain, dryness or itching. Recent cataract surgery left eye EARS: No earache, tinnitus, change in hearing NOSE: No congestion, drainage or bleeding MOUTH/THROAT: No mouth or tooth pain. No difficulty chewing or swallowing. RESPIRATORY: No cough, wheezing, SOB CARDIAC: No chest pain, palpitations. No edema. GI: No abdominal pain. No N/V/D or constipation. No heartburn or reflux. LAs BM today GU: No dysuria, frequency or urgency. No change in urine volume or character MUSCULOSKELETAL:  No back pain. No muscle ache, pain, weakness. Left shoulder and upper arm discomfort after OT   NEUROLOGIC: No dizziness, fainting, headache, No change in mental status.  PSYCHIATRIC: No anxiety, depression, behavior issue. Sleeps well.   Physical Exam  Filed Vitals:   10/18/12 1542  BP: 107/62  Pulse: 62  Temp: 97.4 F (36.3 C)  Resp: 16   Filed Weights   10/18/12 1542  Weight: 154 lb 12.8 oz (70.217 kg)   GENERAL APPEARANCE: No acute distress, appropriately groomed, normal body habitus. Alert, pleasant, conversant. HEAD: Normocephalic, atraumatic EYES: Conjunctiva/lids clear. Pupils round, reactive.  EARS: External exam WNL. Hearing grossly normal. NOSE: No deformity or discharge. MOUTH/THROAT: Lips w/o lesions. Oral mucosa, tongue moist, w/o  lesion. Oropharynx w/o redness or lesions.  NECK: Supple, full ROM. No thyroid tenderness, enlargement or nodule LYMPHATICS: No head, neck or supraclavicular adenopathy RESPIRATORY: Breathing is even, unlabored. Lung sounds are clear and full.  CARDIOVASCULAR: Heart RRR. No murmur or  extra heart sounds   EDEMA: No peripheral  GASTROINTESTINAL: Abdomen is soft, non-tender, not distended w/ normal bowel sounds.  MUSCULOSKELETAL: Moves Rt UE, Bil LE FROM, strength and tone. Left arm in sling, brief exam- shoulder. Upper arm with minimal edema, bruising resolved, mildly tender. Left hand grip strong, no hand or finger edema NEUROLOGIC: Oriented to time, place, person. Speech clear, no tremor.   PSYCHIATRIC: Mood and affect appropriate to situation  ASSESSMENT/PLAN  Closed fracture of unspecified part of upper end of humerus To Dr Montez Morita 4/7,14. Remains NWB left UE. Added Ca/Vit D. OT interventions to reduce edema and preserve elbow/wrist ROM. Pt. Is ambulating with quad walker. Pain is well managed. Remains skilled level of care. RT to Dr.Carter as scheduled 10/29/12  Urinary tract infection, site not specified Pt w/ urinary urgency, frequency/ incontinence. Urine culture returned positive for infection- Cipro started 10/14/12. ASymtomatic today, tolerating antibx without adverse effect.  Rheumatoid arthritis Continues daily prednisone, pain in hands/ knees well managed. Gait is unsteady , requires walker AAT  Senile cataract, unspecified Returned to Dr.Stoneburner 4/17, evaluation satisfactory, eyedrops adjusted. Will be tapered aff prednisolone over 3 weeks.    Follow up: As needed   [name], NP-C 10/01/2012

## 2012-11-05 ENCOUNTER — Encounter: Payer: Medicare Other | Admitting: Internal Medicine

## 2012-11-21 ENCOUNTER — Encounter: Payer: Self-pay | Admitting: Internal Medicine

## 2012-11-21 ENCOUNTER — Non-Acute Institutional Stay (SKILLED_NURSING_FACILITY): Payer: Medicare Other | Admitting: Internal Medicine

## 2012-11-21 DIAGNOSIS — S42209A Unspecified fracture of upper end of unspecified humerus, initial encounter for closed fracture: Secondary | ICD-10-CM

## 2012-11-21 DIAGNOSIS — S42302D Unspecified fracture of shaft of humerus, left arm, subsequent encounter for fracture with routine healing: Secondary | ICD-10-CM

## 2012-11-21 DIAGNOSIS — S42309D Unspecified fracture of shaft of humerus, unspecified arm, subsequent encounter for fracture with routine healing: Secondary | ICD-10-CM

## 2012-11-21 DIAGNOSIS — M069 Rheumatoid arthritis, unspecified: Secondary | ICD-10-CM

## 2012-11-21 NOTE — Progress Notes (Signed)
Date: 11/21/2012  MRN:  [id number] Name:  Kimberly Gaines Sex:  female Age:  79 y.o. DOB:September 17, 1919                       Facility/Room; [facility] Level Of Care:SNF Provider: Murray Hodgkins, M.D.  Emergency Contacts: Contact Information   Name Relation Home Work Hiram A Son [phone]   Biggins,Veronica Daughter [phone]   Rocquel, Askren [phone]  [phone]      Code Status: Full code. Living will.  Allergies: Allergies  Allergen Reactions  . Aleve (Naproxen Sodium) Other (See Comments)    Stomach bleeding.      Chief Complaint  Patient presents with  . Medical Managment of Chronic Issues    new admit to SNF following fracture of the left arm     HPI: This 79yo [place] resident of [name] was admitted to the Rehab section 09/29/12 after evaluation in the ED at [facility]. Pt. reports she fell while walking in her apartment, does not recall any dizziness, H/A, CP, SOB or other symptoms prior to the fall. She had immediate left arm pain, instructed her son to call for help. ED evaluation included x-ray left shoulder, elbow as well as CT scan head/neck. These investigations revealed displaced fractures of the left humeral neck with probable involvement of the inferior glenoid. Sling was applied to left arm , pt was instructed to keep arm immobile and non weight bearing.  Patient has been evaluated by Dr. Myrtie Neither. He recommends conservative treatment for this fracture with shoulder immobilization in a sling. Patient is working with PT and OT to maintain her strength and learn alternate means for completing ADLs. Patient remains at skilled level of care due to limited left upper extremity mobility   Past Medical History  Diagnosis Date  . Iron deficiency anemia, unspecified   . Chronic diastolic heart failure 2012  . Unspecified constipation 2013   . 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 Edema 2012  . Reflux esophagitis 2013  . Unspecified essential hypertension 2012  . Unspecified hypothyroidism 2012  . Mobitz (type) II atrioventricular block 2012  . Senile osteoporosis   . Personal history of malignant neoplasm of breast 1998    Left mastectomy  . Postmenopausal atrophic vaginitis 2014  . Senile cataract, unspecified 08/2012    Left extration/IOL implant  . Arthritis   . Osteoarthrosis, unspecified whether generalized or localized, lower leg 2008    s/p bilateral TKA  . Rheumatoid arthritis(714.0) 2013  . Pain in joint, lower leg 08/06/2012  . Dysuria 07/02/2012  . Urinary tract infection, site not specified 06/18/2012  . Reflux esophagitis 04/16/2012  . Pain in joint, forearm 12/26/2011  . Impacted cerumen 03/09/2011  . Anemia of other chronic disease 02/07/2011  . Urinary frequency 11/03/2010  . Pain in joint, site unspecified 07/20/2010  . Encounter for long-term (current) use of other medications 263.8    07/17/2010  . Other specified hypotension 07/17/2010  . Unspecified adverse effect of other drug, medicinal and biological substance(995.29) 07/17/2010  . Debility, unspecified 07/16/2010  . Dizziness and giddiness 07/17/2005  . Fracture, humerus 09/29/12    proximal left    Past Surgical History  Procedure Laterality Date  . Eye surgery    . Appendectomy  1978  . Abdominal hysterectomy  1978  . Breast surgery    .  Mastectomy Left 1998  . Cataract extraction w/ intraocular lens  implant, bilateral  [id number]    Rt 2003, left 2014  . Joint replacement Bilateral     Knees     Procedures: 04/2002 Bone Density  12/2009: CT head (no contrast): No traumatic intracranial injury or skull fracture. Prominent scalp hematoma over left frontal calvarium to the left orbit. Trace fluid w/in maxillary sinus. Moderate, age appropriate , cortical volume loss, mild scattered small vessel  ischemia. CT maxillofacial: Possible small fracture of nasal bone tip. Soft tissue swelling/blood along nasal passages. Significant calcification of bilateral carotid bifurcations. CT cervical spine: No fracture or subluxation. Degenerative changes along cervical spine present. Carotid Doppler: No significant carotid artery stenosis, bilaterally.  MRI cervical spine: Spondylosis and DDD w/multilevel central and foraminal stenosis. MRI brain: Atrophy, small vessel disease. Small, non compressive meningioma. No acute stroke, hemorrhage, or skull fracture.Left frontal scalp hematoma present.  CXR: No acute process. No displaced rib fractures X-ray, pelvis: NO fracture or dislocation 2D echocardiogram: Mild LV hypertrophy, EF 60-65%. Moderate diastolic dysfunction. Mild mitral/aortic regurgitation, pulmonary HTN. Normal RV. No AoV stenosis. 04/27/2011 Lumbar spine x-ray no acute bony findings 04/27/2011 Pelvis x-ray stable, no acute findings 04/27/2011 Left Hip x-ray no acute bony findings. 06/15/11 EKG: NSR 64BPM, left anterior fasciolar block 05/2012: Epidural injection (lumbar)  09/29/12 left arm: Mostly transverse comminuted fracture of the left humeral shaft with impaction of fracture fragments and with superior and medial displacement of the distal fracture fragment.  Displaced bone fragment inferior to the glenoid may represent a fragment off of the glenoid or the humerus.  There is some cortical irregularity in the glenoid suggesting involvement.  Degenerative changes in the shoulder involving the acromioclavicular and glenohumeral joints with large sub acromial spur formation.  Calcification of the sub acromial space suggesting underlying calcific tendonitis IMPRESSION: Displaced fractures of the left humeral neck with probable involvement of the inferior glenoid.  Underlying degenerative changes and calcific tendonitis   09/29/12 CT head; 1.  Atrophy and small vessel disease. 2. No  evidence for acute intracranial abnormality. 3.  Left occipital scalp edema    09/29/12 CT CS:  There is significant degenerative changes throughout cervical spine.  Bilateral foraminal narrowing is identified at C3- 4, C4-5, C5-6, and C6-7.  There is no evidence for acute fracture or subluxation.  Spinal stenosis is noted C4-C6 secondary to degenerative changes     Consultants: Dr. Shela Commons. Spruill- IM Dr. Chip Boer- GI Dr. Jeanella Cara - Cardiology Dr. Dagoberto Ligas Ophthalmology    Current Outpatient Prescriptions  Medication Sig Dispense Refill  . acetaminophen (TYLENOL) 500 MG tablet Take 500 mg by mouth 4 (four) times daily.      . Bromfenac Sodium (PROLENSA OP) Place 1 drop into the left eye daily.      . carboxymethylcellulose (REFRESH PLUS) 0.5 % SOLN 1 drop 3 (three) times daily as needed.      . carvedilol (COREG) 3.125 MG tablet Take 3.125 mg by mouth 2 (two) times daily with a meal.      . ciprofloxacin (CIPRO) 500 MG tablet       . conjugated estrogens (PREMARIN) vaginal cream Place vaginally 2 (two) times a week.      . docusate sodium (COLACE) 100 MG capsule Take 100 mg by mouth daily.      Marland Kitchen gatifloxacin (ZYMAXID) 0.5 % SOLN Place 1 drop into the left eye 4 (four) times daily.      Marland Kitchen HYDROcodone-acetaminophen (NORCO/VICODIN) 5-325 MG  per tablet Take two tablets every 4 hours as needed pain  360 tablet  2  . levothyroxine (SYNTHROID, LEVOTHROID) 100 MCG tablet Take 100 mcg by mouth daily before breakfast.      . losartan (COZAAR) 50 MG tablet Take 50 mg by mouth every morning.       . meloxicam (MOBIC) 15 MG tablet       . Multiple Vitamin (MULTIVITAMIN WITH MINERALS) TABS Take 1 tablet by mouth every morning.       . pantoprazole (PROTONIX) 40 MG tablet Take 40 mg by mouth every morning.       . polyethylene glycol powder (GLYCOLAX/MIRALAX) powder       . prednisoLONE acetate (PRED FORTE) 1 % ophthalmic suspension Place 1 drop into the left eye 4 (four) times daily.      .  predniSONE (DELTASONE) 10 MG tablet Take 20 mg by mouth daily.      Marland Kitchen spironolactone (ALDACTONE) 25 MG tablet Take 25 mg by mouth every morning.            Immunization History  Administered Date(s) Administered  . DTaP 07/06/2011  . Influenza Whole 03/27/2012     Diet: Regular  History  Substance Use Topics  . Smoking status: Never Smoker   . Smokeless tobacco: Not on file  . Alcohol Use: Yes     Comment: occ    Family History  Problem Relation Age of Onset  . Pneumonia Brother     Review of Systems DATA OBTAINED: from patient,medical record GENERAL: Feels well   No fevers, fatigue, change in appetite or weight SKIN: No itch, rash or open wounds EYES: No eye pain, dryness or itching  No change in vision EARS: No earache, tinnitus, change in hearing NOSE: No congestion, drainage or bleeding MOUTH/THROAT: No mouth or tooth pain  No sore throat No difficulty chewing or swallowing RESPIRATORY: No cough, wheezing, SOB CARDIAC: No chest pain, palpitations Edema is present. CHEST/BREASTS: No discomfort, discharge or lumps in breasts GI: No abdominal pain  No N/V/D or constipation  No heartburn or reflux. Some fluctuance. GU: No dysuria, frequency or urgency  No change in urine volume or character. Nocturia x 3-4. MUSCULOSKELETAL: Chronic changes in the hands compatible with rheumatoid arthritis. Chronic knee pains. New fracture of the left arm; now in a sling.  NEUROLOGIC: No dizziness, fainting, headache, imbalance, numbness  No change in mental status.  PSYCHIATRIC: No feelings of anxiety, depression.  History of insomnia. No behavior issue.   Physical Exam Vital signs: BP 130/68  Pulse 70  Temp(Src) 98 F (36.7 C)  Resp 14  GENERAL APPEARANCE: No acute distress, appropriately groomed, thin body habitus. Alert, pleasant, conversant. HEAD: Normocephalic, atraumatic EYES: Conjunctiva/lids clear. Pupils round, reactive. EOMs intact.  EARS: External exam WNL, canals  clear, TM WNL. Hearing grossly normal. NOSE: No deformity or discharge. MOUTH/THROAT: Lips w/o lesions. Oral mucosa, tongue moist, w/o lesion. Oropharynx w/o redness or lesions.  NECK: Supple, full ROM. No thyroid tenderness, enlargement or nodule LYMPHATICS: No head, neck or supraclavicular adenopathy BREASTS: Left mastectomy RESPIRATORY: Breathing is even, unlabored. Lung sounds are clear and full.  CARDIOVASCULAR: Heart RRR. No murmur or extra heart sounds  ARTERIAL: No carotid, aortic or femoral bruit. Carotid, Femoral, Popliteal, DP,PT pulse 2+.  VENOUS: No varicosities. No venous stasis skin changes  EDEMA: 1+ edema at the ankles and feet. No ascites GASTROINTESTINAL: Abdomen is soft, non-tender, not distended w/ normal bowel sounds. No hepatic or splenic enlargement.  No mass, ventral or inguinal hernia. MUSCULOSKELETAL: Left arm in a sling. Rheumatoid arthritis changes of the hands. Swan-neck deformities. NEUROLOGIC: Oriented to time, place, person. Cranial nerves 2-12 grossly intact, speech clear, no tremor. Patella, brachial DTR 2+. PSYCHIATRIC: Mood and affect appropriate to situation   Screening Score  MMS    PHQ2    PHQ9     Fall Risk    BIMS    Lab reports 08/04/2011 CBC: Rbc 4.30, Hgb 13.8, Hct 42.7 CMP: glucose 79, BUN 40, Creatinine 1.31 TSH 2.859  08/30/11: WBC 11.1, Hgb 12.6, Hct 38.5, Plt 138 Glu 129, BUN 30, Cr.1.1, Na 142, K+4.1 12/15/2011 CBC: Rbc 3.53, Hgb 11.2, Hct 34.1,plT 158 CMP: Sodium 145, Potassium 4.5, glucose 85, BUN 30, Creatinine 1.11,  T Protein 4.6, Albumin 3.0 Lipid: cholesterol 139, triglycerides 75, HDL 44, LDL 80 05/08/2012 BMP: Sodium 143, Potassium 4.3, glucose 92, BUN 28, Creatinine 1.19 Lipid: cholesterol 175, triglyceride 127, HDL 54, LDL 1.7 02/09/2012 Iron 34, UIBC 189, TIBC 223 CBC: Rbc 3.93, Hgb 11.1, Hct 32.5, Platelet 418  Retic 1.1, RBC 3.86 BMP: glucose 93, BUN 20, Creatinine 1.06 BNP 59.8 07/12/2011 CBC: Wbc 8.2, Rbc 3.95, Hgb  10.8, Hct 35.0, Platelet 394 07/03/2012 CBC: Rbc 3.91, Hgb 10.9, Hct 33.8 Retic count 1.0 CMP:  BUN 29, creatinine 1.43, otherwise normal Lipids: Total cholesterol 186, triglycerides 84, HDL 71, LDL 98 TSH 8.017 Rheumatoid factor 329 (normal less than 14)  Admission on 09/28/2012, Discharged on 09/29/2012  Component Date Value Range Status  . WBC 09/28/2012 11.5* 4.0 - 10.5 K/uL Final  . RBC 09/28/2012 4.19  3.87 - 5.11 MIL/uL Final  . Hemoglobin 09/28/2012 12.4  12.0 - 15.0 g/dL Final  . HCT 16/03/9603 38.9  36.0 - 46.0 % Final  . MCV 09/28/2012 92.8  78.0 - 100.0 fL Final  . MCH 09/28/2012 29.6  26.0 - 34.0 pg Final  . MCHC 09/28/2012 31.9  30.0 - 36.0 g/dL Final  . RDW 54/02/8118 15.5  11.5 - 15.5 % Final  . Platelets 09/28/2012 151  150 - 400 K/uL Final  . Sodium 09/28/2012 136  135 - 145 mEq/L Final  . Potassium 09/28/2012 3.4* 3.5 - 5.1 mEq/L Final  . Chloride 09/28/2012 101  96 - 112 mEq/L Final  . CO2 09/28/2012 23  19 - 32 mEq/L Final  . Glucose, Bld 09/28/2012 127* 70 - 99 mg/dL Final  . BUN 14/78/2956 30* 6 - 23 mg/dL Final  . Creatinine, Ser 09/28/2012 1.29* 0.50 - 1.10 mg/dL Final  . Calcium 21/30/8657 8.5  8.4 - 10.5 mg/dL Final  . GFR calc non Af Amer 09/28/2012 35* >90 mL/min Final  . GFR calc Af Amer 09/28/2012 41* >90 mL/min Final   Comment:                                 The eGFR has been calculated                          using the CKD EPI equation.                          This calculation has not been                          validated in all clinical  situations.                          eGFR's persistently                          <90 mL/min signify                          possible Chronic Kidney Disease.  Marland Kitchen Prothrombin Time 09/28/2012 13.8  11.6 - 15.2 seconds Final  . INR 09/28/2012 1.07  0.00 - 1.49 Final  . aPTT 09/28/2012 23* 24 - 37 seconds Final   Annual summary: Hospitalizations: No inpatient hospitalizations.  Emergency room visit in April 2014 for a fall and fracture of the left humerus.   Infection History: Urinary tract infection April 2014 Functional assessment: Needs assistance with dressing, medication supervision, safe ambulation, meal preparation and tray set up. Areas of potential improvement: Healing of the left humeral fracture. Safe mobility. General strengthening. Self-care. Rehabilitation Potential: Good Prognosis for survival: Good  Plan: Fracture, humerus, left, with routine healing, subsequent encounter: Patient is engaged in physical therapy and occupational therapy. She is making progress. She continues to see her orthopedist.  Rheumatoid arthritis(714.0): All other significant deformities of the hands, there is not very much pain. I do not anticipate that this will tend to her recovery from the humerus fracture.  Patient has a chronic mild anemia that we are simply watching and repeating lab work at intervals.  There is a mild hyperglycemia but we will continue to check periodically with lab.  Hypothyroidism is under control on current medication.

## 2012-11-26 ENCOUNTER — Encounter: Payer: Self-pay | Admitting: Geriatric Medicine

## 2012-11-26 ENCOUNTER — Non-Acute Institutional Stay (SKILLED_NURSING_FACILITY): Payer: Medicare Other | Admitting: Geriatric Medicine

## 2012-11-26 DIAGNOSIS — M1710 Unilateral primary osteoarthritis, unspecified knee: Secondary | ICD-10-CM

## 2012-11-26 DIAGNOSIS — N390 Urinary tract infection, site not specified: Secondary | ICD-10-CM

## 2012-11-26 DIAGNOSIS — S42209A Unspecified fracture of upper end of unspecified humerus, initial encounter for closed fracture: Secondary | ICD-10-CM

## 2012-11-26 DIAGNOSIS — M069 Rheumatoid arthritis, unspecified: Secondary | ICD-10-CM

## 2012-11-26 NOTE — Assessment & Plan Note (Signed)
Pain/ joint stiffness is at baseline. Pt is able to manage her ADLs despite hand deformities. Continue prednisone

## 2012-11-26 NOTE — Assessment & Plan Note (Addendum)
Pt evaluated by Dr.Carter today. Satisfied with healing process, pt to advance activity as tolerated. He will see her in several months.  No hydrocodone use in several weeks- d/c medication. [place] Family will be assisting, PT/OT will continue as well

## 2012-11-26 NOTE — Assessment & Plan Note (Signed)
Gait is stable with walker, pain management adequate with current medications

## 2012-11-26 NOTE — Assessment & Plan Note (Signed)
Resolved

## 2012-11-26 NOTE — Progress Notes (Signed)
Patient ID: Kimberly Gaines, female   DOB: Jun 30, 1919, 79 y.o.   MRN: [id number] [facility] [phone])  Chief Complaint  Patient presents with  . Rehab Discharge    Humerus fracture    HPI: This 79yo [place] resident of [name] was admitted to the Rehab section 09/29/12 after a fall at home followed by evaluation in the ED at [facility].  ED evaluation included x-ray left shoulder, elbow as well as CT scan head/neck. These investigations revealed displaced fractures of the left humeral neck with probable involvement of the inferior glenoid. Sling was applied to left arm , pt was instructed to keep arm immobile and non weight bearing. Patient was evaluated by Dr. Myrtie Neither. He recommended conservative treatment for this fracture with shoulder immobilization in a sling. Patient HAS followed with Dr.Carter at intervals; fracture is healing, actvitiy and weight bearing status has been advanced. Pt.has been working with PT and OT throughout her Rehab stay, has regained independent functional status with ambulation and ADLs. [place]. [place] apartment[place]  Other issues during rehab stay:  Patient developed urinary tract symptoms, culture returned positive for infection treated with PO Cipro.  Patient saw her  ophthalmologist, Dr. Dagoberto Ligas for followup after her cataract left cataract surgery in March. Evaluation was satisfactory. Post op eyedrops have tapered and stopped. Vision is satisfactory   Allergies  Allergies  Allergen Reactions  . Aleve (Naproxen Sodium) Other (See Comments)    Stomach bleeding.    Medications  Current outpatient prescriptions:acetaminophen (TYLENOL) 500 MG tablet, Take 500 mg by mouth 4 (four) times daily., Disp: , Rfl: ;  Calcium Carbonate-Vitamin D (CALCIUM 600+D) 600-400 MG-UNIT per tablet, Take 1 tablet by mouth 2 (two) times daily., Disp: , Rfl: ;  carboxymethylcellulose (REFRESH PLUS) 0.5 % SOLN, 1 drop 3  (three) times daily as needed., Disp: , Rfl:  carvedilol (COREG) 3.125 MG tablet, Take 3.125 mg by mouth 2 (two) times daily with a meal., Disp: , Rfl: ;  conjugated estrogens (PREMARIN) vaginal cream, Place vaginally 2 (two) times a week., Disp: , Rfl: ;  docusate sodium (COLACE) 100 MG capsule, Take 100 mg by mouth daily., Disp: , Rfl: ;  ferrous sulfate 325 (65 FE) MG tablet, Take 325 mg by mouth daily with breakfast., Disp: , Rfl:  levothyroxine (SYNTHROID, LEVOTHROID) 100 MCG tablet, Take 100 mcg by mouth daily before breakfast., Disp: , Rfl: ;  losartan (COZAAR) 50 MG tablet, Take 50 mg by mouth every morning. , Disp: , Rfl: ;  meloxicam (MOBIC) 15 MG tablet, , Disp: , Rfl: ;  Multiple Vitamin (MULTIVITAMIN WITH MINERALS) TABS, Take 1 tablet by mouth every morning. , Disp: , Rfl: ;  pantoprazole (PROTONIX) 40 MG tablet, Take 40 mg by mouth every morning. , Disp: , Rfl:  polyethylene glycol powder (GLYCOLAX/MIRALAX) powder, , Disp: , Rfl: ;  predniSONE (DELTASONE) 10 MG tablet, Take 20 mg by mouth daily., Disp: , Rfl: ;  spironolactone (ALDACTONE) 25 MG tablet, Take 25 mg by mouth every morning. , Disp: , Rfl:   Data Reviewed    Radiology:   [id number], external 07/03/2012 CBC: Rbc 3.91, Hgb 10.9, Hct 33.8  Retic count 1.0  CMP: BUN 29, creatinine 1.43, otherwise normal  Lipids: Total cholesterol 186, triglycerides 84, HDL 71, LDL 98  TSH 8.017  Rheumatoid factor 329 (normal less than 14)  10/14/2012 urine culture in the 100,000 colonies Escherichia coli   10/30/2012: WBC 10.8, hemoglobin 9.7,  hematocrit 29.5, platelets 222  Glucose or glucose 77, BUN 32, creatinine 1.38, sodium 142, potassium 5.0 LFTs WNL albumin 3.3  TSH 0.83    Review of Systems  DATA OBTAINED: from patient, nurse, medical record,  GENERAL: Feels well No fevers, fatigue, change in appetite. Has gained weight while i nRehab SKIN: No itch, rash or open wounds EYES: No eye pain, dryness or itching  No change in  vision EARS: No earache, tinnitus, change in hearing NOSE: No congestion, drainage or bleeding MOUTH/THROAT: No mouth, tooth pain or sore throat  No difficulty chewing or swallowing RESPIRATORY: No cough, wheezing, SOB CARDIAC: No chest pain, palpitations  No edema. GI: No abdominal pain  No N/V/D or constipation  No heartburn or reflux  GU: No dysuria, frequency or urgency  No change in urine volume or character   MUSCULOSKELETAL: Minimal discomfort left arm  No back pain  No muscle ache, pain, weakness  Gait is steady   NEUROLOGIC: No dizziness, fainting, headache,  No change in mental status.  PSYCHIATRIC: No feelings of anxiety, depression Sleeps well.    Physical Exam Filed Vitals:   11/26/12 1053  BP: 123/78  Pulse: 92  Temp: 98.2 F (36.8 C)  Weight: 160 lb 6.4 oz (72.757 kg)  SpO2: 96%   GENERAL APPEARANCE: No acute distress, appropriately groomed, normal body habitus. Alert, pleasant, conversant. HEAD: Normocephalic, atraumatic EYES: Conjunctiva/lids clear. Pupils round, reactive. EOMs intact.  EARS: External exam WNL Hearing grossly normal. NOSE: No deformity or discharge. MOUTH/THROAT: Lips w/o lesions. Oral mucosa, tongue moist, w/o lesion. Oropharynx w/o redness or lesions.  NECK: Supple, full ROM. No thyroid tenderness, enlargement or nodule LYMPHATICS: No head, neck or supraclavicular adenopathy RESPIRATORY: Breathing is even, unlabored. Lung sounds are clear and full.  CARDIOVASCULAR: Heart RRR. No murmur or extra heart sounds  EDEMA: Trace peripheral edema.  GASTROINTESTINAL: Abdomen is soft, non-tender, not distended w/ normal bowel sounds. MUSCULOSKELETAL: Moves Rt UE with full ROM, Left elbow/wrist with FROM, shoulder remains limited. Back is without kyphosis, scoliosis or spinal process tenderness. Gait is steady with walker NEUROLOGIC: Oriented to time, place, person. Speech clear, no tremor.  PSYCHIATRIC: Mood and affect appropriate to  situation  ASSESSMENT/PLAN  Urinary tract infection, site not specified Resolved  Osteoarthrosis, unspecified whether generalized or localized, lower leg Gait is stable with walker, pain management adequate with current medications  Rheumatoid arthritis(714.0) Pain/ joint stiffness is at baseline. Pt is able to manage her ADLs despite hand deformities. Continue prednisone  Closed fracture of unspecified part of upper end of humerus Pt evaluated by Dr.Carter today. Satisfied with healing process, pt to advance activity as tolerated. He will see her in several months.  No hydrocodone use in several weeks- d/c medication. [place]. Family will be assisting, PT/OT will continue as well   Follow up: Dr Chilton Si in [facility] clinic 2-4 weeks  Ashanty Coltrane T.Charvi Gammage, NP-C 11/26/2012

## 2012-12-17 ENCOUNTER — Other Ambulatory Visit: Payer: Self-pay | Admitting: Internal Medicine

## 2012-12-17 DIAGNOSIS — Z1231 Encounter for screening mammogram for malignant neoplasm of breast: Secondary | ICD-10-CM

## 2012-12-19 ENCOUNTER — Encounter: Payer: Self-pay | Admitting: *Deleted

## 2012-12-24 ENCOUNTER — Encounter: Payer: Self-pay | Admitting: Geriatric Medicine

## 2012-12-24 ENCOUNTER — Encounter: Payer: Medicare Other | Admitting: Internal Medicine

## 2012-12-24 ENCOUNTER — Non-Acute Institutional Stay (SKILLED_NURSING_FACILITY): Payer: Medicare Other | Admitting: Geriatric Medicine

## 2012-12-24 DIAGNOSIS — M1710 Unilateral primary osteoarthritis, unspecified knee: Secondary | ICD-10-CM

## 2012-12-24 DIAGNOSIS — E039 Hypothyroidism, unspecified: Secondary | ICD-10-CM | POA: Insufficient documentation

## 2012-12-24 DIAGNOSIS — M069 Rheumatoid arthritis, unspecified: Secondary | ICD-10-CM

## 2012-12-24 DIAGNOSIS — I5032 Chronic diastolic (congestive) heart failure: Secondary | ICD-10-CM

## 2012-12-24 DIAGNOSIS — D509 Iron deficiency anemia, unspecified: Secondary | ICD-10-CM | POA: Insufficient documentation

## 2012-12-24 NOTE — Assessment & Plan Note (Signed)
Unclear patient has been taking medication as directed well at home. Repeat TSH tomorrow

## 2012-12-24 NOTE — Assessment & Plan Note (Signed)
Increased generalized pain and increased right knee pain. X-rays negative for changes in the right knee prosthesis. Unclear whether patient has been taking her medications as directed. Resume patient's medications as prescribed, stay rehabilitation for monitoring and observation. Repeat lab tomorrow

## 2012-12-24 NOTE — Progress Notes (Signed)
Patient ID: Kimberly Gaines, female   DOB: [birth year], 79 y.o.   MRN: [id number] [facility] [phone])  Chief Complaint  Patient presents with  . pain, immobility    HPI: This 79 year old female resident of [facility], Independent Living section was admitted to the Rehabilitation section last evening due to complaints of increased pain in her right knee, generalized pain and difficulty. The on-call provider was notified who ordered x-rays and a venous Doppler study for today.  X-ray of right hip femur knee tib-fib and ankle are without acute changes. Bilateral lower extremity venous Doppler study is negative for DVT. Nurse reports patient likely has not been taking any prednisone, Synthroid or Tylenol since Friday.   Allergies  Allergies  Allergen Reactions  . Aleve (Naproxen Sodium) Other (See Comments)    Stomach bleeding.    Medications  Current outpatient prescriptions:acetaminophen (TYLENOL) 500 MG tablet, Take 500 mg by mouth 4 (four) times daily. Take 1 tablet as needed for pain relief., Disp: , Rfl: ;  Calcium Carbonate-Vitamin D (CALCIUM 600+D) 600-400 MG-UNIT per tablet, Take 1 tablet by mouth 2 (two) times daily., Disp: , Rfl: ;  carboxymethylcellulose (REFRESH PLUS) 0.5 % SOLN, 1 drop 3 (three) times daily as needed., Disp: , Rfl:  carvedilol (COREG) 3.125 MG tablet, Take 3.125 mg by mouth 2 (two) times daily with a meal. Take 1 tablet twice daily to treat heart failure /  HTN, Disp: , Rfl: ;  conjugated estrogens (PREMARIN) vaginal cream, Place 2 g vaginally 2 (two) times a week. Apply 2 grams in vagina and labia twice weekly., Disp: , Rfl: ;  docusate sodium (COLACE) 100 MG capsule, Take 100 mg by mouth daily. Take 1 capsule daily for stool softener., Disp: , Rfl:  ferrous sulfate 325 (65 FE) MG tablet, Take 325 mg by mouth daily with breakfast. Take 1 tablet daily to treat anemia., Disp: , Rfl: ;  gatifloxacin (ZYMAXID) 0.5 % SOLN, ,  Disp: , Rfl: ;  HYDROcodone-acetaminophen (NORCO/VICODIN) 5-325 MG per tablet, , Disp: , Rfl: ;  levothyroxine (SYNTHROID, LEVOTHROID) 100 MCG tablet, Take 100 mcg by mouth daily before breakfast. Take 1 tablet daily for thyroid supplement., Disp: , Rfl:  losartan (COZAAR) 50 MG tablet, Take 50 mg by mouth every morning. Take 1 tablet daily to control BP/treat HF., Disp: , Rfl: ;  meloxicam (MOBIC) 15 MG tablet, Take 15 mg by mouth daily. Take 1 tablet daily to help arthritis pain., Disp: , Rfl: ;  Multiple Vitamin (MULTIVITAMIN WITH MINERALS) TABS, Take 1 tablet by mouth every morning. Take 1 tablet daily as a nutritional supplement., Disp: , Rfl:  pantoprazole (PROTONIX) 40 MG tablet, Take 40 mg by mouth every morning. Take 1 tablet daily for reflux symptoms., Disp: , Rfl: ;  polyethylene glycol powder (GLYCOLAX/MIRALAX) powder, , Disp: , Rfl: ;  prednisoLONE acetate (PRED FORTE) 1 % ophthalmic suspension, , Disp: , Rfl: ;  predniSONE (DELTASONE) 10 MG tablet, Take 20 mg by mouth daily. Take 2 tablets daily to help with arthritis., Disp: , Rfl: ;  PROLENSA 0.07 % SOLN, , Disp: , Rfl:  spironolactone (ALDACTONE) 25 MG tablet, Take 25 mg by mouth every morning. Take 1 tablet daily to treat HF., Disp: , Rfl:   Data Reviewed    Radiology: [facility] 12/24/2012:   X-ray right hip: Chronic changes, osteoarthritic changes. No evidence of acute pathology  X-ray right femur no acute pathology  X-ray right knee normal right prosthetic knee  X-ray right tibia/fibula: No radiographic evidence of acute pathology  X-ray right ankle no acute pathology   Lab: [facility], external 07/03/2012 CBC: Rbc 3.91, Hgb 10.9, Hct 33.8   Retic count 1.0   CMP: BUN 29, creatinine 1.43, otherwise normal   Lipids: Total cholesterol 186, triglycerides 84, HDL 71, LDL 98   TSH 8.017   Rheumatoid factor 329 (normal less than 14)  10/14/2012 urine culture in the 100,000 colonies Escherichia coli  10/30/2012: WBC 10.8,  hemoglobin 9.7, hematocrit 29.5, platelets 222   Glucose or glucose 77, BUN 32, creatinine 1.38, sodium 142, potassium 5.0 LFTs WNL albumin 3.3   TSH 0.83    Other:  [facility] 12/24/2012 Bilateral lower extremity venous Doppler: Negative for DVT   Review of Systems  DATA OBTAINED: from patient, nurse GENERAL: Does not feel well, increased  fatigue, decreased appetite  SKIN: No itch, rash or open wounds EYES: No eye pain, dryness or itching  No change in vision EARS: No earache, tinnitus, change in hearing NOSE: No congestion, drainage or bleeding MOUTH/THROAT: No mouth or tooth pain  No sore throat No difficulty chewing or swallowing RESPIRATORY: No cough, wheezing, SOB CARDIAC: No chest pain, palpitations  No edema. GI: No abdominal pain  No N/V/D or constipation  No heartburn or reflux  GU: No dysuria, frequency or urgency  No change in urine volume or character MUSCULOSKELETAL: Increased Joint pain, sp rt knee  Generalized weakness  Gait is unsteady  No recent falls.  NEUROLOGIC: No dizziness, fainting, headache  No change in mental status.  PSYCHIATRIC: No feelings of anxiety, depression Sleeps well.    Physical Exam Filed Vitals:   12/24/12 1539  BP: 119/73  Pulse: 64  Temp: 97.4 F (36.3 C)  Resp: 20  Weight: 151 lb 6.4 oz (68.675 kg)  SpO2: 94%   There is no height on file to calculate BMI.  GENERAL APPEARANCE: No acute distress, appropriately groomed, normal body habitus. Awake, pleasant, less conversant than usual, voice very soft, appears unwell HEAD: Normocephalic, atraumatic EYES: Conjunctiva/lids clear. Pupils round, reactive.  EARS:  Hearing grossly normal. NOSE: No deformity or discharge. MOUTH/THROAT: Lips w/o lesions. Oral mucosa, tongue moist, w/o lesion. Oropharynx w/o redness or lesions.  NECK: Supple, full ROM. No thyroid tenderness, enlargement or nodule LYMPHATICS: No head, neck or supraclavicular adenopathy RESPIRATORY: Breathing is  even, unlabored. Lung sounds are clear and full.  CARDIOVASCULAR: Heart RRR. No murmur or extra heart sounds  ARTERIAL: No carotid bruit.  DP pulse 2+.  VENOUS: No varicosities. No venous stasis skin changes  EDEMA: Trace LE edema.  GASTROINTESTINAL: Abdomen is soft, non-tender, not distended w/ normal bowel sounds.  MUSCULOSKELETAL: Limited range of motion left upper extremity (recent humerus fracture), stiff range of motion right shoulder, right knee and left knee   NEUROLOGIC: Oriented to time, place, person. Speech clear, no tremor.  PSYCHIATRIC: Mood and affect appropriate to situation  ASSESSMENT/PLAN  Osteoarthrosis, unspecified whether generalized or localized, lower leg Increased generalized pain and increased right knee pain. X-rays negative for changes in the right knee prosthesis. Unclear whether patient has been taking her medications as directed. Resume patient's medications as prescribed, stay rehabilitation for monitoring and observation.  Rheumatoid arthritis(714.0) Increased generalized pain and increased right knee pain. X-rays negative for changes in the right knee prosthesis. Unclear whether patient has been taking her medications as directed. Resume patient's medications as prescribed, stay rehabilitation for monitoring and observation. Repeat lab tomorrow   Chronic diastolic heart  failure No significant signs of volume overload, patient does have trace edema in her bilateral lower extremities. Will recheck lab tomorrow  Iron deficiency anemia, unspecified Generalized fatigue, poor appetite. Recheck CBC/Fe level tomorrow  Unspecified hypothyroidism Unclear patient has been taking medication as directed well at home. Repeat TSH tomorrow   >25 minutes spent with patient, nurse, chart.  Lab 12/25/2012: CBC, CMP, iron level, TSH  Follow up: As needed  Kaylyn Garrow T.Dashawn Bartnick, NP-C 12/24/2012

## 2012-12-24 NOTE — Patient Instructions (Signed)
Continue current medications. 

## 2012-12-24 NOTE — Assessment & Plan Note (Signed)
Increased generalized pain and increased right knee pain. X-rays negative for changes in the right knee prosthesis. Unclear whether patient has been taking her medications as directed. Resume patient's medications as prescribed, stay rehabilitation for monitoring and observation.

## 2012-12-24 NOTE — Assessment & Plan Note (Signed)
No significant signs of volume overload, patient does have trace edema in her bilateral lower extremities. Will recheck lab tomorrow

## 2012-12-24 NOTE — Assessment & Plan Note (Signed)
Generalized fatigue, poor appetite. Recheck CBC/Fe level tomorrow

## 2012-12-25 ENCOUNTER — Ambulatory Visit (HOSPITAL_COMMUNITY): Payer: Medicare Other

## 2012-12-26 DIAGNOSIS — M1710 Unilateral primary osteoarthritis, unspecified knee: Secondary | ICD-10-CM

## 2012-12-26 DIAGNOSIS — D509 Iron deficiency anemia, unspecified: Secondary | ICD-10-CM

## 2012-12-26 DIAGNOSIS — M25569 Pain in unspecified knee: Secondary | ICD-10-CM

## 2012-12-26 DIAGNOSIS — M069 Rheumatoid arthritis, unspecified: Secondary | ICD-10-CM

## 2012-12-31 ENCOUNTER — Ambulatory Visit (HOSPITAL_COMMUNITY): Admission: RE | Admit: 2012-12-31 | Payer: Medicare Other | Source: Ambulatory Visit

## 2013-01-01 ENCOUNTER — Encounter: Payer: Self-pay | Admitting: Geriatric Medicine

## 2013-01-01 ENCOUNTER — Non-Acute Institutional Stay (SKILLED_NURSING_FACILITY): Payer: Medicare Other | Admitting: Geriatric Medicine

## 2013-01-01 DIAGNOSIS — E039 Hypothyroidism, unspecified: Secondary | ICD-10-CM

## 2013-01-01 DIAGNOSIS — M1710 Unilateral primary osteoarthritis, unspecified knee: Secondary | ICD-10-CM

## 2013-01-01 DIAGNOSIS — M069 Rheumatoid arthritis, unspecified: Secondary | ICD-10-CM

## 2013-01-01 DIAGNOSIS — R5381 Other malaise: Secondary | ICD-10-CM | POA: Insufficient documentation

## 2013-01-01 DIAGNOSIS — D509 Iron deficiency anemia, unspecified: Secondary | ICD-10-CM

## 2013-01-01 NOTE — Assessment & Plan Note (Addendum)
Less pain, improved mobility, continue PT/OT

## 2013-01-01 NOTE — Assessment & Plan Note (Signed)
Repeat CBC, fe level

## 2013-01-01 NOTE — Progress Notes (Signed)
Patient ID: Kimberly Gaines, female   DOB: 06/12/1920, 79 y.o.   MRN: [id number] [facility] [phone])  Chief Complaint  Patient presents with  . Osteoarthritis  . Rheumatoid Arthritis    HPI: This 79 year old female resident of [facility], Independent Living section was admitted to the Rehabilitation section 12/23/12 due to complaints of increased pain in her right knee, generalized pain and difficulty mobilizing. X-rays and venous Doppler study without acute changes, negative for DVT. Nurse reports patient likely did not take any prednisone, Synthroid or Tylenol for several days. Since rehabilitation arrival, patient has been taking her medications as directed. She has less overall pain though does remain debilitated, requiring some assistance with ADLs and ambulation. She has been working with PT and OT. Patient's family have decided to make a move to permanent move to Assisted Living once she has progressed to that level of care. Laboratory studies on July 1 showed persistent mild iron deficient anemia, patient continues with iron supplementation, TSH is within normal limits, rheumatoid factor is elevated at as would be expected off prednisone.    Allergies  Allergies  Allergen Reactions  . Aleve (Naproxen Sodium) Other (See Comments)    Stomach bleeding.    Medications  Current outpatient prescriptions:acetaminophen (TYLENOL) 325 MG tablet, Take 650 mg by mouth 3 (three) times daily., Disp: , Rfl: ;  acetaminophen (TYLENOL) 500 MG tablet, Take 500 mg by mouth every 6 (six) hours as needed for pain. Take 1 tablet as needed for pain relief., Disp: , Rfl: ;  Calcium Carbonate-Vitamin D (CALCIUM 600+D) 600-400 MG-UNIT per tablet, Take 1 tablet by mouth 2 (two) times daily., Disp: , Rfl:  carboxymethylcellulose (REFRESH PLUS) 0.5 % SOLN, 1 drop 3 (three) times daily as needed., Disp: , Rfl: ;  carvedilol (COREG) 3.125 MG tablet, Take 3.125 mg by mouth  2 (two) times daily with a meal. Take 1 tablet twice daily to treat heart failure /  HTN, Disp: , Rfl: ;  docusate sodium (COLACE) 100 MG capsule, Take 100 mg by mouth daily. Take 1 capsule daily for stool softener., Disp: , Rfl:  ferrous sulfate 325 (65 FE) MG tablet, Take 325 mg by mouth daily with breakfast. Take 1 tablet daily to treat anemia., Disp: , Rfl: ;  levothyroxine (SYNTHROID, LEVOTHROID) 100 MCG tablet, Take 100 mcg by mouth daily before breakfast. Take 1 tablet daily for thyroid supplement., Disp: , Rfl: ;  losartan (COZAAR) 50 MG tablet, Take 50 mg by mouth every morning. Take 1 tablet daily to control BP/treat HF., Disp: , Rfl:  meloxicam (MOBIC) 15 MG tablet, Take 15 mg by mouth daily. Take 1 tablet daily to help arthritis pain., Disp: , Rfl: ;  Multiple Vitamin (MULTIVITAMIN WITH MINERALS) TABS, Take 1 tablet by mouth every morning. Take 1 tablet daily as a nutritional supplement., Disp: , Rfl: ;  pantoprazole (PROTONIX) 40 MG tablet, Take 40 mg by mouth every morning. Take 1 tablet daily for reflux symptoms., Disp: , Rfl:  polyethylene glycol powder (GLYCOLAX/MIRALAX) powder, , Disp: , Rfl: ;  predniSONE (DELTASONE) 10 MG tablet, Take 20 mg by mouth daily. Take 2 tablets daily to help with arthritis., Disp: , Rfl: ;  PROLENSA 0.07 % SOLN, , Disp: , Rfl: ;  spironolactone (ALDACTONE) 25 MG tablet, Take 25 mg by mouth every morning. Take 1 tablet daily to treat HF., Disp: , Rfl:  conjugated estrogens (PREMARIN) vaginal cream, Place 2 g vaginally 2 (two) times a week. Apply 2  grams in vagina and labia twice weekly., Disp: , Rfl: ;  gatifloxacin (ZYMAXID) 0.5 % SOLN, , Disp: , Rfl: ;  prednisoLONE acetate (PRED FORTE) 1 % ophthalmic suspension, , Disp: , Rfl:   Data Reviewed    Radiology: [facility] 12/24/2012:   X-ray right hip: Chronic changes, osteoarthritic changes. No evidence of acute pathology  X-ray right femur no acute pathology  X-ray right knee normal right prosthetic  knee  X-ray right tibia/fibula: No radiographic evidence of acute pathology  X-ray right ankle no acute pathology    Lab: [facility], external 07/03/2012 CBC: Rbc 3.91, Hgb 10.9, Hct 33.8   Retic count 1.0   CMP: BUN 29, creatinine 1.43, otherwise normal   Lipids: Total cholesterol 186, triglycerides 84, HDL 71, LDL 98   TSH 8.017   Rheumatoid factor 329 (normal less than 14)  10/14/2012 urine culture in the 100,000 colonies Escherichia coli  10/30/2012: WBC 10.8, hemoglobin 9.7, hematocrit 29.5, platelets 222   Glucose or glucose 77, BUN 32, creatinine 1.38, sodium 142, potassium 5.0 LFTs WNL albumin 3.3   TSH 0.83 12/25/2012 WBC 10.5, hemoglobin 9.54, hematacrit 28.5, platelets 570  Glucose 109, BUN 38, creatinine 2.02, sodium 138, potassium 5.1. LFTs WNL. Albumin 2.7  TSH 0.380   rheumatoid factor 315    Other:  [facility] 12/24/2012 Bilateral lower extremity venous Doppler: Negative for DVT   Review of Systems  DATA OBTAINED: from patient, nurse GENERAL: Feeling better, eating better  SKIN: No itch, rash or open wounds EYES: No eye pain, dryness or itching  No change in vision EARS: No earache, tinnitus, change in hearing NOSE: No congestion, drainage or bleeding MOUTH/THROAT: No mouth or tooth pain  No sore throat No difficulty chewing or swallowing RESPIRATORY: No cough, wheezing, SOB CARDIAC: No chest pain, palpitations  No edema. GI: No abdominal pain  No N/V/D or constipation  No heartburn or reflux  GU: No dysuria, frequency or urgency  No change in urine volume or character MUSCULOSKELETAL: Less Joint pain, sp rt knee , feels stronger  Gait is unsteady  No recent falls.  NEUROLOGIC: No dizziness, fainting, headache  No change in mental status.  PSYCHIATRIC: No feelings of anxiety, depression Sleeps well.    Physical Exam Filed Vitals:   01/01/13 1527  BP: 118/80  Pulse: 95  Temp: 97.6 F (36.4 C)  Resp: 17  Weight: 153 lb 12.8 oz (69.763 kg)    There is no height on file to calculate BMI.  GENERAL APPEARANCE: No acute distress, appropriately groomed, normal body habitus. Awake, pleasant, conversant HEAD: Normocephalic, atraumatic EYES: Conjunctiva/lids clear. Pupils round, reactive.  EARS:  Hearing grossly normal. NOSE: No deformity or discharge. MOUTH/THROAT: Lips w/o lesions. Oral mucosa, tongue moist, w/o lesion. Oropharynx w/o redness or lesions.  NECK: Supple, full ROM. No thyroid tenderness, enlargement or nodule LYMPHATICS: No head, neck or supraclavicular adenopathy RESPIRATORY: Breathing is even, unlabored. Lung sounds are clear and full.  CARDIOVASCULAR: Heart RRR. No murmur or extra heart sounds  ARTERIAL: No carotid bruit.  DP pulse 2+.  VENOUS: No varicosities. No venous stasis skin changes  EDEMA: Trace LE edema.  GASTROINTESTINAL: Abdomen is soft, non-tender, not distended w/ normal bowel sounds.  MUSCULOSKELETAL: Limited range of motion left upper extremity (recent humerus fracture), stiff range of motion right shoulder, right knee and left knee   NEUROLOGIC: Oriented to time, place, person. Speech clear, no tremor.  PSYCHIATRIC: Mood and affect appropriate to situation  ASSESSMENT/PLAN  Iron  deficiency anemia, unspecified Repeat CBC, fe level  Osteoarthrosis, unspecified whether generalized or localized, lower leg Less pain, better mobility, continue PT/OT  Rheumatoid arthritis(714.0) Less pain, improved mobility, continue PT/OT  Unspecified hypothyroidism TSH satisfactory, continue Synthroid  Debility Requires assist with some ADLs, medication management, meal preparation. OA/RA impact pt's ability for self care.  Hospital bed is helpful in allowing pt to get in/out of bed herself.    Lab 01/03/2013: CBC, CMP, iron level  Follow up: As needed  Cliford Sequeira T.Kabe Mckoy, NP-C 01/01/2013

## 2013-01-01 NOTE — Assessment & Plan Note (Signed)
TSH satisfactory, continue Synthroid

## 2013-01-01 NOTE — Assessment & Plan Note (Addendum)
Requires assist with some ADLs, medication management, meal preparation. OA/RA impact pt's ability for self care.  Hospital bed is helpful in allowing pt to get in/out of bed herself.

## 2013-01-01 NOTE — Assessment & Plan Note (Addendum)
Less pain, better mobility, continue PT/OT

## 2013-01-14 ENCOUNTER — Non-Acute Institutional Stay (SKILLED_NURSING_FACILITY): Payer: Medicare Other | Admitting: Geriatric Medicine

## 2013-01-14 DIAGNOSIS — M069 Rheumatoid arthritis, unspecified: Secondary | ICD-10-CM

## 2013-01-14 DIAGNOSIS — D509 Iron deficiency anemia, unspecified: Secondary | ICD-10-CM

## 2013-01-14 DIAGNOSIS — R5381 Other malaise: Secondary | ICD-10-CM

## 2013-01-14 NOTE — Progress Notes (Signed)
Patient ID: Kimberly Gaines, female   DOB: 03-04-1920, 79 y.o.   MRN: [id number] [facility] [phone])  Code Status: Living Will, [facility]  Contact Information   Name Relation Home Work [place] A Son [phone]   Biggins,Veronica Daughter [phone]   Christasia, Angeletti [phone]  [phone]      Chief Complaint  Patient presents with  . Discharge Note    Rehab discharge, OA, debility, anemia    HPI: This 80 year old female resident of [facility], Independent Living section was admitted to the Rehabilitation section 12/23/12 due to complaints of increased pain in her right knee, generalized pain and difficulty mobilizing. X-rays and venous Doppler study without acute changes, negative for DVT. Nurse reports patient likely did not take any prednisone, Synthroid or Tylenol for several days. Since rehabilitation arrival, patient has been taking her medications as directed, has been working with PT and OT. She has less overall pain, is ambulating independently with walker,  does remain with some debility. Patient requires medication management, meal preparation, assistance with bathing. She is most appropriate for Assisted Living level of care.  Most recent laboratory studies on July 10 showed persistent low Hgb/Hct despite improved iron level.   Allergies  Allergies  Allergen Reactions  . Aleve (Naproxen Sodium) Other (See Comments)    Stomach bleeding.    Medications    acetaminophen 500 MG tablet  Commonly known as:  TYLENOL  Take 500 mg by mouth every 6 (six) hours as needed for pain. Take 1 tablet as needed for pain relief.     acetaminophen 325 MG tablet  Commonly known as:  TYLENOL  Take 650 mg by mouth 3 (three) times daily.     CALCIUM 600+D 600-400 MG-UNIT per tablet  Generic drug:  Calcium Carbonate-Vitamin D  Take 1 tablet by mouth 2 (two) times daily.      carboxymethylcellulose 0.5 % Soln  Commonly known as:  REFRESH PLUS  1 drop 3 (three) times daily as needed.     carvedilol 3.125 MG tablet  Commonly known as:  COREG  Take 3.125 mg by mouth 2 (two) times daily with a meal. Take 1 tablet twice daily to treat heart failure /  HTN     docusate sodium 100 MG capsule  Commonly known as:  COLACE  Take 100 mg by mouth daily. Take 1 capsule daily for stool softener.     ferrous sulfate 325 (65 FE) MG tablet  Take 325 mg by mouth daily with breakfast. Take 1 tablet daily to treat anemia.     levothyroxine 100 MCG tablet  Commonly known as:  SYNTHROID, LEVOTHROID  Take 100 mcg by mouth daily before breakfast. Take 1 tablet daily for thyroid supplement.     losartan 50 MG tablet  Commonly known as:  COZAAR  Take 50 mg by mouth every morning. Take 1 tablet daily to control BP/treat HF.     meloxicam 15 MG tablet  Commonly known as:  MOBIC  Take 15 mg by mouth daily. Take 1 tablet daily to help arthritis pain.     multivitamin with minerals Tabs  Take 1 tablet by mouth every morning. Take 1 tablet daily as a nutritional supplement.     pantoprazole 40 MG tablet  Commonly known as:  PROTONIX  Take 40 mg by mouth every morning. Take 1 tablet daily for reflux symptoms.     polyethylene glycol powder powder  Commonly known as:  GLYCOLAX/MIRALAX     predniSONE 10 MG tablet  Commonly known as:  DELTASONE  Take 20 mg by mouth daily. Take 2 tablets daily to help with arthritis.     spironolactone 25 MG tablet  Commonly known as:  ALDACTONE  Take 25 mg by mouth every morning. Take 1 tablet daily to treat HF.       Data Reviewed    Radiology: [facility] 12/24/2012:   X-ray right hip: Chronic changes, osteoarthritic changes. No evidence of acute pathology  X-ray right femur no acute pathology  X-ray right knee normal right prosthetic knee  X-ray right tibia/fibula: No radiographic evidence of acute pathology  X-ray  right ankle no acute pathology    Lab: [facility], external 07/03/2012 CBC: Rbc 3.91, Hgb 10.9, Hct 33.8   Retic count 1.0   CMP: BUN 29, creatinine 1.43, otherwise normal   Lipids: Total cholesterol 186, triglycerides 84, HDL 71, LDL 98   TSH 8.017   Rheumatoid factor 329 (normal less than 14)  10/14/2012 urine culture in the 100,000 colonies Escherichia coli  10/30/2012: WBC 10.8, hemoglobin 9.7, hematocrit 29.5, platelets 222   Glucose or glucose 77, BUN 32, creatinine 1.38, sodium 142, potassium 5.0 LFTs WNL albumin 3.3   TSH 0.83 12/25/2012 WBC 10.5, hemoglobin 9.54, hematacrit 28.5, platelets 570  Glucose 109, BUN 38, creatinine 2.02, sodium 138, potassium 5.1. LFTs WNL. Albumin 2.7  TSH 0.380   rheumatoid factor 315  Fe 27 01/03/2013 WBC 14.1, hgb 9.6, hct 29.2, Plt 464  Glu 88, BUN 37, Cr 1.65, Na 137, K+ 4.7 LFTs WNL. Alb 3.0  Fe 66    Other:  [facility] 12/24/2012 Bilateral lower extremity venous Doppler: Negative for DVT   Review of Systems  DATA OBTAINED: from patient, GENERAL: Feels well   No fevers, fatigue, change in appetite or weight SKIN: No itch, rash or open wounds EYES: No eye pain, dryness or itching  No change in vision EARS: No earache, tinnitus, change in hearing NOSE: No congestion, drainage or bleeding MOUTH/THROAT: No mouth or tooth pain  No sore throat No difficulty chewing or swallowing RESPIRATORY: No cough, wheezing, SOB CARDIAC: No chest pain, palpitations  No edema. GI: No abdominal pain  No N/V/D or constipation  No heartburn or reflux  GU: No dysuria, frequency or urgency  No change in urine volume or character MUSCULOSKELETAL: Less Joint pain, sp rt knee , feels stronger  Gait is unsteady  No recent falls.  NEUROLOGIC: No dizziness, fainting, headache  No change in mental status.  PSYCHIATRIC: No feelings of anxiety, depression Sleeps well.    Physical Exam Filed Vitals:   01/14/13 1649  BP: 123/78  Pulse: 74  Weight:  156 lb 12.8 oz (71.124 kg)  SpO2: 96%   GENERAL APPEARANCE: No acute distress, appropriately groomed, normal body habitus. Alert, pleasant, conversant HEAD: Normocephalic, atraumatic EYES: Conjunctiva/lids clear. Pupils round, reactive.  EARS:  Hearing grossly normal. NOSE: No deformity or discharge. MOUTH/THROAT: Lips w/o lesions. Oral mucosa, tongue moist, w/o lesion. Oropharynx w/o redness or lesions.  NECK: Supple, full ROM. No thyroid tenderness, enlargement or nodule LYMPHATICS: No head, neck or supraclavicular adenopathy RESPIRATORY: Breathing is even, unlabored. Lung sounds are clear and full.  CARDIOVASCULAR: Heart RRR. No murmur or extra heart sounds  ARTERIAL: No carotid bruit.  DP pulse 2+.  VENOUS: No varicosities. No venous stasis skin changes  EDEMA: Trace LE edema.  GASTROINTESTINAL: Abdomen is soft, non-tender, not distended w/ normal bowel  sounds.  MUSCULOSKELETAL: Limited range of motion left upper extremity (recent humerus fracture), stiff range of motion right shoulder, right knee and left knee. Is able to maneuver independenlty in/out of bed. Bed rail is helpful NEUROLOGIC: Oriented to time, place, person. Speech clear, no tremor.  PSYCHIATRIC: Mood and affect appropriate to situation  ASSESSMENT/PLAN  Rheumatoid arthritis(714.0) Pain/function at baseline status, continue current medication  Iron deficiency anemia, unspecified Fe level improved with supplementation but not H/H. Pt. Has been taking PPI for quite  A while, possibly B12 is low- check before next clinic visit.  Debility Continues to require medication management, meal preparation, assist with some ADLs. This is likely new baseline, will benefit from AL level of care.    Follow up:  2-3 weeks in [facility] clinic  01/31/2013: CBC, iron level, B12 level  Temia Debroux T.Sitlali Koerner, NP-C 01/14/2013

## 2013-01-21 ENCOUNTER — Encounter: Payer: Self-pay | Admitting: Geriatric Medicine

## 2013-01-21 NOTE — Assessment & Plan Note (Signed)
Fe level improved with supplementation but not H/H. Pt. Has been taking PPI for quite  A while, possibly B12 is low- check before next clinic visit.

## 2013-01-21 NOTE — Assessment & Plan Note (Signed)
Pain/function at baseline status, continue current medication

## 2013-01-21 NOTE — Assessment & Plan Note (Addendum)
Continues to require medication management, meal preparation, assist with some ADLs. This is likely new baseline, will benefit from AL level of care.

## 2013-01-24 ENCOUNTER — Ambulatory Visit (HOSPITAL_COMMUNITY)
Admission: RE | Admit: 2013-01-24 | Discharge: 2013-01-24 | Disposition: A | Discharge status: Home / Self Care | Payer: Medicare Other | Source: Ambulatory Visit | Attending: Internal Medicine | Admitting: Internal Medicine

## 2013-01-24 DIAGNOSIS — Z1231 Encounter for screening mammogram for malignant neoplasm of breast: Secondary | ICD-10-CM | POA: Insufficient documentation

## 2013-01-28 ENCOUNTER — Encounter: Payer: Self-pay | Admitting: Internal Medicine

## 2013-01-28 ENCOUNTER — Non-Acute Institutional Stay (SKILLED_NURSING_FACILITY): Payer: Medicare Other | Admitting: Internal Medicine

## 2013-01-28 ENCOUNTER — Non-Acute Institutional Stay: Payer: Medicare Other | Admitting: Internal Medicine

## 2013-01-28 VITALS — BP 130/82 | HR 60

## 2013-01-28 DIAGNOSIS — M25561 Pain in right knee: Secondary | ICD-10-CM

## 2013-01-28 DIAGNOSIS — M069 Rheumatoid arthritis, unspecified: Secondary | ICD-10-CM

## 2013-01-28 DIAGNOSIS — M25569 Pain in unspecified knee: Secondary | ICD-10-CM

## 2013-01-28 DIAGNOSIS — D509 Iron deficiency anemia, unspecified: Secondary | ICD-10-CM

## 2013-01-28 DIAGNOSIS — M1710 Unilateral primary osteoarthritis, unspecified knee: Secondary | ICD-10-CM

## 2013-01-28 DIAGNOSIS — M25562 Pain in left knee: Secondary | ICD-10-CM

## 2013-01-28 MED ORDER — PREDNISONE 20 MG PO TABS: ORAL_TABLET | ORAL | Status: DC

## 2013-01-28 NOTE — Progress Notes (Signed)
Subjective:    Patient ID: Kimberly Gaines, female    DOB: August 27, 1919, 79 y.o.   MRN: [id number]  HPI  Current Outpatient Prescriptions on File Prior to Visit  Medication Sig Dispense Refill  . acetaminophen (TYLENOL) 325 MG tablet Take 650 mg by mouth 3 (three) times daily.      Marland Kitchen acetaminophen (TYLENOL) 500 MG tablet Take 500 mg by mouth every 6 (six) hours as needed for pain. Take 1 tablet as needed for pain relief.      . Calcium Carbonate-Vitamin D (CALCIUM 600+D) 600-400 MG-UNIT per tablet Take 1 tablet by mouth 2 (two) times daily.      . carboxymethylcellulose (REFRESH PLUS) 0.5 % SOLN 1 drop 3 (three) times daily as needed.      . carvedilol (COREG) 3.125 MG tablet Take 3.125 mg by mouth 2 (two) times daily with a meal. Take 1 tablet twice daily to treat heart failure /  HTN      . docusate sodium (COLACE) 100 MG capsule Take 100 mg by mouth daily. Take 1 capsule daily for stool softener.      . ferrous sulfate 325 (65 FE) MG tablet Take 325 mg by mouth daily with breakfast. Take 1 tablet daily to treat anemia.      Marland Kitchen levothyroxine (SYNTHROID, LEVOTHROID) 100 MCG tablet Take 100 mcg by mouth daily before breakfast. Take 1 tablet daily for thyroid supplement.      Marland Kitchen losartan (COZAAR) 50 MG tablet Take 50 mg by mouth every morning. Take 1 tablet daily to control BP/treat HF.      . meloxicam (MOBIC) 15 MG tablet Take 15 mg by mouth daily. Take 1 tablet daily to help arthritis pain.      . Multiple Vitamin (MULTIVITAMIN WITH MINERALS) TABS Take 1 tablet by mouth every morning. Take 1 tablet daily as a nutritional supplement.      . pantoprazole (PROTONIX) 40 MG tablet Take 40 mg by mouth every morning. Take 1 tablet daily for reflux symptoms.      . polyethylene glycol powder (GLYCOLAX/MIRALAX) powder       . predniSONE (DELTASONE) 10 MG tablet Take 20 mg by mouth daily. Take 2 tablets daily to help with arthritis.      Marland Kitchen spironolactone (ALDACTONE) 25 MG tablet Take 25 mg by mouth  every morning. Take 1 tablet daily to treat HF.       No current facility-administered medications on file prior to visit.     Review of Systems  Constitutional: Positive for activity change.  Eyes: Negative.   Respiratory: Negative.   Cardiovascular: Positive for leg swelling. Negative for chest pain and palpitations.  Gastrointestinal: Positive for abdominal pain. Negative for nausea and abdominal distention.  Endocrine: Negative.   Genitourinary:       Nocturia x 3. No dysuria. Rare incontinence.  Musculoskeletal:       Deformities from RA of both hands: ulnar drift, swollen MCP. HX of bilateral knee replacements.  Skin: Negative.   Allergic/Immunologic: Negative.   Neurological: Negative.   Hematological:       Chronic anemia most likely due to RA  Psychiatric/Behavioral: Negative.        Objective:BP 130/82  Pulse 60    Physical Exam  Constitutional: She is oriented to person, place, and time. She appears well-developed and well-nourished. No distress.  HENT:  Right Ear: External ear normal.  Left Ear: External ear normal.  Nose: Nose normal.  Mouth/Throat: Oropharynx is clear and  moist. No oropharyngeal exudate.  Eyes: Conjunctivae are normal. Pupils are equal, round, and reactive to light. Left eye exhibits no discharge.  Corrective lenses.  Neck: Neck supple. No JVD present. No tracheal deviation present. No thyromegaly present.  Cardiovascular: Normal rate, regular rhythm, normal heart sounds and intact distal pulses.  Exam reveals no gallop and no friction rub.   No murmur heard. Pulmonary/Chest: Effort normal and breath sounds normal. No respiratory distress. She has no wheezes. She has no rales. She exhibits no tenderness.  Abdominal: Soft. Bowel sounds are normal. She exhibits no distension and no mass. There is no tenderness.  Musculoskeletal: She exhibits edema.  Lymphadenopathy:    She has no cervical adenopathy.  Neurological: She is alert and oriented  to person, place, and time. She has normal reflexes. No cranial nerve deficit. Coordination normal.  Skin: No rash noted. No erythema. No pallor.  Psychiatric: She has a normal mood and affect. Her behavior is normal. Judgment and thought content normal.      LABS REVIEWED 01/03/13 iron 66  Iron sat 30%  Ferritin 359  Transferrin 140  CBC: hgb 9.6, MCV 88  CMP: glu 88, BUN 37, creat 1.65 01/24/13 Mammogram: S/P left mastectomy, otherwise normal    Assessment & Plan:  Rheumatoid arthritis(714.0): I think the knee pains are a flare of her RA, but she also has OA and she thinks the weather started her most recent increase in pain   - Plan: increase (DELTASONE) 20 MG tablet for 9 days, then return to usual 20 mg daily.  Knee pain, bilateral: as above  Iron deficiency anemia, unspecified: Hgb stable at 9.6 gm %

## 2013-01-28 NOTE — Progress Notes (Signed)
Subjective:    Patient ID: Kimberly Gaines, female    DOB: [birth year], 79 y.o.   MRN: [id number]  HPI Seen 12/26/12 in Rehab unit.  Readmitted 12/23/12 to [facility] rehab unit at [facility] care level due to an severe increase in pain in the right knee.She had not taken her prednisone for several days. She was in so much pain that she could not walk.She has a chronic gait instability and is now unable to safely walk or take care of her own needs. She has a history of bilateral knee replacements for OA. She also has RA with severe joint deformities of the hands and wrists.  Other problems include anemia (iron deficient and a component of anemia of chronic disease related to her RA), CKD 3,hyperglycemia (possibly related to chronic prednisone therapy).  She had a venous Doppler of the legs on 12/24/12 which ruled out DVT.  Past Medical History  Diagnosis Date  . Iron deficiency anemia, unspecified   . Chronic diastolic heart failure 2012  . Unspecified constipation 2013  . 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 Edema 2012  . Reflux esophagitis 2013  . Unspecified essential hypertension 2012  . Mobitz (type) II atrioventricular block 2012  . Senile osteoporosis   . Personal history of malignant neoplasm of breast 1998    Left mastectomy  . Postmenopausal atrophic vaginitis 2014  . Senile cataract, unspecified 08/2012    Left extration/IOL implant  . Arthritis   . Osteoarthrosis, unspecified whether generalized or localized, lower leg 2008    s/p bilateral TKA  . Rheumatoid arthritis(714.0) 2013  . Pain in joint, lower leg 08/06/2012  . Dysuria 07/02/2012  . Urinary tract infection, site not specified 06/18/2012  . Reflux esophagitis 04/16/2012  . Pain in joint, forearm 12/26/2011  . Impacted cerumen 03/09/2011  . Anemia of other chronic disease 02/07/2011  . Urinary frequency 11/03/2010  . Pain in joint, site unspecified 07/20/2010  . Encounter  for long-term (current) use of other medications 263.8    07/17/2010  . Other specified hypotension 07/17/2010  . Unspecified adverse effect of other drug, medicinal and biological substance(995.29) 07/17/2010  . Debility, unspecified 07/16/2010  . Dizziness and giddiness 07/17/2005  . Fracture, humerus 09/29/12    proximal left  . Unspecified hypothyroidism 2012   Past Surgical History  Procedure Laterality Date  . Appendectomy  1978  . Abdominal hysterectomy  1978  . Mastectomy Left 1998  . Cataract extraction w/ intraocular lens  implant, bilateral  [id number]    Rt 2003, left 2014  . Joint replacement Bilateral     Knees  . Breast surgery Left 1998    mastectomy  . Eye surgery Right 2003    cataract extraction/IOL implant   History   Social History  . Marital Status: Widowed    Spouse Name: N/A    Number of Children: N/A  . Years of Education: N/A   Occupational History  . Not on file.   Social History Main Topics  . Smoking status: Never Smoker   . Smokeless tobacco: Never Used  . Alcohol Use: Yes     Comment: occ  . Drug Use: No  . Sexually Active: Not on file   Other Topics Concern  . Not on file   Social History Narrative   Widow. Resides in Independent Living apartment at [name] since 2005. 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 No smoking history, minimal  alcohol, intake. Stopped driving several years ago.    FAMILY HX indicated that her mother is deceased. She indicated that her father is deceased. She indicated that both of her sisters are deceased. She indicated that her brother is deceased. She indicated that her daughter is alive. She indicated that both of her sons are alive.      MEDICATIONS REVIEWED  Review of Systems  Constitutional: Positive for activity change.  Eyes: Negative.   Respiratory: Negative.   Cardiovascular: Positive for leg swelling. Negative for chest pain and palpitations.  Gastrointestinal: Positive  for abdominal pain. Negative for nausea and abdominal distention.  Endocrine: Negative.   Genitourinary:       Nocturia x 3. No dysuria. Rare incontinence.  Musculoskeletal:       Deformities from RA of both hands: ulnar drift, swollen MCP. HX of bilateral knee replacements.  Skin: Negative.   Allergic/Immunologic: Negative.   Neurological: Negative.   Hematological:       Chronic anemia most likely due to RA  Psychiatric/Behavioral: Negative.        Objective:BP 130/70  Pulse 70    Physical Exam  Constitutional: She is oriented to person, place, and time. She appears well-developed and well-nourished. No distress.  HENT:  Right Ear: External ear normal.  Left Ear: External ear normal.  Nose: Nose normal.  Mouth/Throat: Oropharynx is clear and moist. No oropharyngeal exudate.  Eyes: Conjunctivae are normal. Pupils are equal, round, and reactive to light. Left eye exhibits no discharge.  Corrective lenses.  Neck: Neck supple. No JVD present. No tracheal deviation present. No thyromegaly present.  Cardiovascular: Normal rate, regular rhythm, normal heart sounds and intact distal pulses.  Exam reveals no gallop and no friction rub.   No murmur heard. Pulmonary/Chest: Effort normal and breath sounds normal. No respiratory distress. She has no wheezes. She has no rales. She exhibits no tenderness.  Abdominal: Soft. Bowel sounds are normal. She exhibits no distension and no mass. There is no tenderness.  Musculoskeletal: She exhibits edema.  Bilateral knee scars. Right hip scar. Ulnar drift of fingers both hands. Swollen MCP joints bilaterally. No inflammation. Right knee is exquisitely tender to touch. Cira Servant is a small joint effusion.  Lymphadenopathy:    She has no cervical adenopathy.  Neurological: She is alert and oriented to person, place, and time. She has normal reflexes. No cranial nerve deficit. Coordination normal.  Skin: No rash noted. No erythema. No pallor.   Psychiatric: She has a normal mood and affect. Her behavior is normal. Judgment and thought content normal.          Assessment & Plan:  Knee pain, bilateral: worse in the right knee. Probably represents a flare of her RA, but there may be a component of OA as well. Prednisone was restarted on entry to the Rehab Unit. She is already beginning to feel better.  Iron deficiency anemia, unspecified: continue to monitor lab.  Rheumatoid arthritis(714.0): chronic. Like;ly has flare from missing her usual prednisone dose.  Osteoarthrosis, unspecified whether generalized or localized, lower leg; if she fails to respond well to prednisone, then ortho consult may be necessary.

## 2013-02-01 ENCOUNTER — Encounter: Payer: Self-pay | Admitting: Geriatric Medicine

## 2013-02-01 ENCOUNTER — Non-Acute Institutional Stay: Payer: Medicare Other | Admitting: Geriatric Medicine

## 2013-02-01 DIAGNOSIS — I4891 Unspecified atrial fibrillation: Secondary | ICD-10-CM

## 2013-02-01 HISTORY — DX: Unspecified atrial fibrillation: I48.91

## 2013-02-01 NOTE — Assessment & Plan Note (Signed)
New onset AF today, symptomatic with SOB, generalized weakness. Will benefit from rate control, start digoxin today. Load with .5mg  in divided doses today, start .125mg  qd tomorrow. Check dig level/ BMP tomorrow. Pt. Is at increased risk of thromboembolism, start Eliquis.  Transfer to Rehab section for closer monitoring while loading digoxin.  Arrange cardiology evaluation next week.

## 2013-02-01 NOTE — Progress Notes (Signed)
Patient ID: Kimberly Gaines, female   DOB: August 08, 1919, 79 y.o.   MRN: [id number] [facility] [phone])  Code Status: Living Will, [facility]  Contact Information   Name Relation Home Work [place] A Son [phone]   Biggins,Veronica Daughter [phone]   Wilmoth, Rasnic [phone]  [phone]      Chief Complaint  Patient presents with  . Tachycardia, weakness    HPI: This 79 year old female resident of [facility], Assisted Living section is evaluated today due to new onset tachycardia accompanied by SOB, generalized weakness. Nurse called earlier today reporting these symptoms, EKG was performed which revealed HR 139, irregular rhythm, no p-waves.    Allergies  Allergen Reactions  . Aleve (Naproxen Sodium) Other (See Comments)    Stomach bleeding.    Medications Reviewed   Data Reviewed  Radiology Exams   [facility] X-Ray 12/24/2012:   X-ray right hip: Chronic changes, osteoarthritic changes. No evidence of acute pathology  X-ray right femur no acute pathology  X-ray right knee normal right prosthetic knee  X-ray right tibia/fibula: No radiographic evidence of acute pathology  X-ray right ankle no acute pathology  Cardiovascular exams 02/01/2013 EKG: Irregular rhythm, heart rate 139, no P waves, LVH  Laboratory Studies   [facility] lab, external 07/03/2012 CBC: Rbc 3.91, Hgb 10.9, Hct 33.8   Retic count 1.0   CMP: BUN 29, creatinine 1.43, otherwise normal   Lipids: Total cholesterol 186, triglycerides 84, HDL 71, LDL 98   TSH 8.017   Rheumatoid factor 329 (normal less than 14)  10/14/2012 urine culture in the 100,000 colonies Escherichia coli  10/30/2012: WBC 10.8, hemoglobin 9.7, hematocrit 29.5, platelets 222   Glucose or glucose 77, BUN 32, creatinine 1.38, sodium 142, potassium 5.0 LFTs WNL albumin 3.3   TSH 0.83 12/25/2012 WBC 10.5,  hemoglobin 9.54, hematacrit 28.5, platelets 570  Glucose 109, BUN 38, creatinine 2.02, sodium 138, potassium 5.1. LFTs WNL. Albumin 2.7  TSH 0.380   rheumatoid factor 315  Fe 27 01/03/2013 WBC 14.1, hgb 9.6, hct 29.2, Plt 464  Glu 88, BUN 37, Cr 1.65, Na 137, K+ 4.7 LFTs WNL. Alb 3.0  Fe 66 01/29/2013 WBC 11.0, hemoglobin 10.8, hematocrit 32.3, platelets 212.  Glucose 84, BUN 39, creatinine 1.81, sodium 143, potassium 4.4    Other:  [facility] Ultrasound 12/24/2012 Bilateral lower extremity venous Doppler: Negative for DVT   Review of Systems  DATA OBTAINED: from patient, nurse, medical record GENERAL: Does not feel well today; fatigued, winded SKIN: No itch, rash or open wounds EYES: No eye pain, dryness or itching  No change in vision EARS: No earache,  change in hearing NOSE: No congestion, drainage or bleeding MOUTH/THROAT: No mouth or tooth pain  No sore throat No difficulty chewing or swallowing RESPIRATORY: No cough, wheezing SOB present CARDIAC: No chest pain, palpitations  No edema. GI: No abdominal pain  No N/V/D or constipation  No heartburn or reflux  GU: No dysuria, frequency or urgency  No change in urine volume or character MUSCULOSKELETAL: Less Joint pain, sp rt knee   Gait is unsteady  No recent falls.  NEUROLOGIC: No dizziness, fainting, headache  No change in mental status.  PSYCHIATRIC: No feelings of anxiety, depression Sleeps well.    Physical Exam Filed Vitals:   02/01/13 1733  BP: 113/77  Temp: 97 F (36.1 C)  Resp: 24  SpO2: 97%   GENERAL APPEARANCE: No acute distress, appropriately groomed, normal body  habitus. Alert, pleasant, conversant HEAD: Normocephalic, atraumatic EYES: Conjunctiva/lids clear. Pupils round, reactive.  EARS:  Hearing grossly normal. NOSE: No deformity or discharge. MOUTH/THROAT: Lips w/o lesions. Oral mucosa, tongue moist, w/o lesion. Oropharynx w/o redness or lesions.  NECK: Supple, full ROM. No thyroid tenderness,  enlargement or nodule LYMPHATICS: No head, neck or supraclavicular adenopathy RESPIRATORY: Breathing is mildly labored w/ conversation. Lung sounds are clear and full. O2 Sat 97% CARDIOVASCULAR: Heart IRRR.(new) No murmur or extra heart sounds  ARTERIAL: No carotid bruit.  DP pulse 2+.  VENOUS: No varicosities. No venous stasis skin changes  EDEMA: Trace LE edema.  GASTROINTESTINAL: Abdomen is soft, non-tender, not distended w/ normal bowel sounds.  MUSCULOSKELETAL: Limited range of motion left upper extremity (recent humerus fracture), stiff range of motion right shoulder, right knee and left knee. Is able to maneuver independenlty in/out of bed. Bed rail is helpful NEUROLOGIC: Oriented to time, place, person. Speech clear, no tremor.  PSYCHIATRIC: Mood and affect appropriate to situation  ASSESSMENT/PLAN  Atrial fibrillation New onset AF today, symptomatic with SOB, generalized weakness. Will benefit from rate control, start digoxin today. Load with .5mg  in divided doses today, start .125mg  qd tomorrow. Check dig level/ BMP tomorrow. Pt. Is at increased risk of thromboembolism, start Eliquis.  Transfer to Rehab section for closer monitoring while loading digoxin.  Arrange cardiology evaluation next week.    Follow up:  As needed  Jubilee Vivero T.Makayla Lanter, NP-C 02/01/2013

## 2013-02-06 ENCOUNTER — Other Ambulatory Visit: Payer: Self-pay | Admitting: Cardiology

## 2013-02-06 ENCOUNTER — Ambulatory Visit
Admission: RE | Admit: 2013-02-06 | Discharge: 2013-02-06 | Disposition: A | Discharge status: Home / Self Care | Payer: Medicare Other | Source: Ambulatory Visit | Attending: Cardiology | Admitting: Cardiology

## 2013-02-06 DIAGNOSIS — J9811 Atelectasis: Secondary | ICD-10-CM

## 2013-02-06 DIAGNOSIS — R0602 Shortness of breath: Secondary | ICD-10-CM

## 2013-02-11 ENCOUNTER — Non-Acute Institutional Stay: Payer: Medicare Other | Admitting: Geriatric Medicine

## 2013-02-11 ENCOUNTER — Encounter: Payer: Self-pay | Admitting: Geriatric Medicine

## 2013-02-11 ENCOUNTER — Encounter: Payer: Medicare Other | Admitting: Internal Medicine

## 2013-02-11 DIAGNOSIS — L8990 Pressure ulcer of unspecified site, unspecified stage: Secondary | ICD-10-CM

## 2013-02-11 DIAGNOSIS — S8990XA Unspecified injury of unspecified lower leg, initial encounter: Secondary | ICD-10-CM

## 2013-02-11 DIAGNOSIS — S99912A Unspecified injury of left ankle, initial encounter: Secondary | ICD-10-CM | POA: Insufficient documentation

## 2013-02-11 DIAGNOSIS — L8992 Pressure ulcer of unspecified site, stage 2: Secondary | ICD-10-CM | POA: Insufficient documentation

## 2013-02-11 NOTE — Progress Notes (Signed)
Patient ID: Kimberly Gaines, female   DOB: 1920/06/14, 79 y.o.   MRN: [id number] [facility] [phone])  Code Status: Living Will, [facility]  Contact Information   Name Relation Home Work [place] [phone]   Biggins,Veronica Daughter [phone]   Tattianna, Schnarr [phone]  [phone]      Chief Complaint  Patient presents with  . Ankle Injury    HPI: This 79 year old female resident of [facility], Assisted Living section is evaluated today due to left ankle injury and right foot ulcer. Patient had an unwitnessed fall in her assisted-living apartment this morning, she reportedly tried to get up in her wheelchair moved. She fell to ground. Had left ankle pain. Nurse also requests evaluation of a wound on the patient's right foot. A small open area was noted on patient's right bunion on August 16. Basic wound care protocol was followed wound was dressed with Mepilex.   Allergies  Allergen Reactions  . Aleve [Naproxen Sodium] Other (See Comments)    Stomach bleeding.    Medications Reviewed   Data Reviewed  Radiology Exams   [facility] X-Ray 12/24/2012:   X-ray right hip: Chronic changes, osteoarthritic changes. No evidence of acute pathology  X-ray right femur no acute pathology  X-ray right knee normal right prosthetic knee  X-ray right tibia/fibula: No radiographic evidence of acute pathology  X-ray right ankle no acute pathology  Cardiovascular exams 02/01/2013 EKG: Irregular rhythm, heart rate 139, no P waves, LVH  Laboratory Studies   [facility] lab, external 07/03/2012 CBC: Rbc 3.91, Hgb 10.9, Hct 33.8   Retic count 1.0   CMP: BUN 29, creatinine 1.43, otherwise normal   Lipids: Total cholesterol 186, triglycerides 84, HDL 71, LDL 98   TSH 8.017   Rheumatoid factor 329 (normal less than 14)  10/14/2012 urine culture in the 100,000  colonies Escherichia coli  10/30/2012: WBC 10.8, hemoglobin 9.7, hematocrit 29.5, platelets 222   Glucose or glucose 77, BUN 32, creatinine 1.38, sodium 142, potassium 5.0 LFTs WNL albumin 3.3   TSH 0.83 12/25/2012 WBC 10.5, hemoglobin 9.54, hematacrit 28.5, platelets 570  Glucose 109, BUN 38, creatinine 2.02, sodium 138, potassium 5.1. LFTs WNL. Albumin 2.7  TSH 0.380   rheumatoid factor 315  Fe 27 01/03/2013 WBC 14.1, hgb 9.6, hct 29.2, Plt 464  Glu 88, BUN 37, Cr 1.65, Na 137, K+ 4.7 LFTs WNL. Alb 3.0  Fe 66 01/29/2013 WBC 11.0, hemoglobin 10.8, hematocrit 32.3, platelets 212.  Glucose 84, BUN 39, creatinine 1.81, sodium 143, potassium 4.4    Other:  [facility] Ultrasound 12/24/2012 Bilateral lower extremity venous Doppler: Negative for DVT   Review of Systems  DATA OBTAINED: from patient, nurse, medical record GENERAL: His weight well today, no generalized fatigue, is eating and sleeping well SKIN: No itch, rash  See HPI MUSCULOSKELETAL: Left ankle pain, right foot pain  NEUROLOGIC: No dizziness, fainting, headache  No change in mental status.  PSYCHIATRIC: No feelings of anxiety, depression Sleeps well.    Physical Exam Filed Vitals:   02/11/13 1026  BP: 150/80  Pulse: 82   GENERAL APPEARANCE: No acute distress, appropriately groomed, normal body habitus. Alert, pleasant, conversant HEAD: Normocephalic, atraumatic EYES: Conjunctiva/lids clear.  RESPIRATORY:  Breathing is nonlabored  ARTERIAL: RT.  DP pulse 2+.  VENOUS: No varicosities. No venous stasis skin changes  EDEMA: Trace LE edema.  MUSCULOSKELETAL: Left ankle with swelling, mild tenderness lateral and medial malleolus. Patient is  able to flex and extend foot without difficulty. She is unable to bear full weight on her left lower extremity. Right first metatarsal head with ulceration that is surrounded by red swollen tissue. There is hot and tender as well. Ulcers less than 1 cm in diameter with significant  depth there is slow 100% at the base of the wound. NEUROLOGIC: Oriented to time, place, person. Speech clear, no tremor.  PSYCHIATRIC: Mood and affect appropriate to situation  ASSESSMENT/PLAN  Left ankle injury Mild left ankle pain and swelling after a fall today. Will obtain 2 view x-rays of the ankle and foot to evaluate for fracture. Patient is to be nonweightbearing until x-ray results is returned  Pressure ulcer stage II Patient with a pressure ulcer on her right bunion appearance is concerning for cellulitis, possible osteomyelitis. We'll start p.o. Avelox today, also obtain x-ray of the foot. Wound was packed lightly today with alginate dressing covered with silver Mepilex. Will reassess wound 02/13/2013   Follow up: 02/13/13  Piera Downs T.Nakya Weyand, NP-C 02/11/2013

## 2013-02-11 NOTE — Assessment & Plan Note (Signed)
Mild left ankle pain and swelling after a fall today. Will obtain 2 view x-rays of the ankle and foot to evaluate for fracture. Patient is to be nonweightbearing until x-ray results is returned

## 2013-02-11 NOTE — Assessment & Plan Note (Signed)
Patient with a pressure ulcer on her right bunion appearance is concerning for cellulitis, possible osteomyelitis. We'll start p.o. Avelox today, also obtain x-ray of the foot. Wound was packed lightly today with alginate dressing covered with silver Mepilex. Will reassess wound 02/13/2013

## 2013-02-12 ENCOUNTER — Non-Acute Institutional Stay (SKILLED_NURSING_FACILITY): Payer: Medicare Other | Admitting: Geriatric Medicine

## 2013-02-12 DIAGNOSIS — L8990 Pressure ulcer of unspecified site, unspecified stage: Secondary | ICD-10-CM

## 2013-02-12 DIAGNOSIS — S82892A Other fracture of left lower leg, initial encounter for closed fracture: Secondary | ICD-10-CM | POA: Insufficient documentation

## 2013-02-12 DIAGNOSIS — I4891 Unspecified atrial fibrillation: Secondary | ICD-10-CM

## 2013-02-12 DIAGNOSIS — S82899A Other fracture of unspecified lower leg, initial encounter for closed fracture: Secondary | ICD-10-CM

## 2013-02-12 DIAGNOSIS — L8992 Pressure ulcer of unspecified site, stage 2: Secondary | ICD-10-CM

## 2013-02-12 NOTE — Progress Notes (Signed)
Patient ID: Kimberly Gaines, female   DOB: 1919/11/10, 79 y.o.   MRN: [id number] [facility] [phone])  Code Status: Living Will, Kimberly Gaines [facility]  Contact Information   Name Relation Home Work [place] A Son [phone]   Gaines,Kimberly Daughter [phone]   Kimberly, Gaines [phone]  [phone]      Chief Complaint  Patient presents with  . Foot wound  . Ankle Injury  . Atrial Fibrillation    HPI: This 79 year old female resident of [facility], Assisted Living section was transferred to the rehabilitation section August 18 due to mobility issues related to her left ankle fracture. X-ray on August 18 revealed fracture of the lateral left malleolus and medial malleolus. Patient is currently nonweightbearing, for orthopedic evaluation later today.  Wound on the patient's right bunion foot is to be examined today.   Allergies  Allergen Reactions  . Aleve [Naproxen Sodium] Other (See Comments)    Stomach bleeding.    Medications Reviewed   Data Reviewed  Radiology Exams   [facility] X[facility]Ray 12/24/2012:   X-ray right hip: Chronic changes, osteoarthritic changes. 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 Minimal osteoarthritis. Acute fracture lateral malleolus of acute fracture knew the medial malleolus. No subluxation, dislocation or lytic destructive lesion.   Left foot x-ray, 2 views moderate hallux valgus deformity. Minimal might minimal to mild gastroenteritis at the distal toes. No acute fracture, subluxation dislocation or lytic destructive lesion.   Right foot x-ray, 2 views: Market  hallux valgus deformity. Mild osteoarthritis he diffusely. Mild soft tissue swelling first metatarsal head consistent with bunion. Tiny plantar and posterior calcaneal spurs. No acute fracture, subluxation dislocation or lytic destructive lesion. No atrophic evidence for osteomyelitis.  Cardiovascular exams 02/01/2013 EKG: Irregular rhythm, heart rate 139, no Gaines waves, LVH  Laboratory Studies   [facility] lab, external 07/03/2012 CBC: Rbc 3.91, Hgb 10.9, Hct 33.8   Retic count 1.0   CMP: BUN 29, creatinine 1.43, otherwise normal   Lipids: Total cholesterol 186, triglycerides 84, HDL 71, LDL 98   TSH 8.017   Rheumatoid factor 329 (normal less than 14)   10/30/2012: WBC 10.8, hemoglobin 9.7, hematocrit 29.5, platelets 222   Glucose or glucose 77, BUN 32, creatinine 1.38, sodium 142, potassium 5.0 LFTs WNL albumin 3.3   TSH 0.83 12/25/2012 WBC 10.5, hemoglobin 9.54, hematacrit 28.5, platelets 570  Glucose 109, BUN 38, creatinine 2.02, sodium 138, potassium 5.1. LFTs WNL. Albumin 2.7  TSH 0.380   rheumatoid factor 315  Fe 27 01/03/2013 WBC 14.1, hgb 9.6, hct 29.2, Plt 464  Glu 88, BUN 37, Cr 1.65, Na 137, K+ 4.7 LFTs WNL. Alb 3.0  Fe 66 01/29/2013 WBC 11.0, hemoglobin 10.8, hematocrit 32.3, platelets 212.  Glucose 84, BUN 39, creatinine 1.81, sodium 143, potassium 4.4 01/31/2013: Iron 51  B12 371 08/092014: glucose 87, BUN 37, creatinine 1.62, sodium 140, potassium 4.7  Digoxin 1.2 02/05/2013 digoxin 1.1    Other:  [facility] Ultrasound 12/24/2012 Bilateral lower extremity venous Doppler: Negative for DVT   Review of Systems  DATA OBTAINED: from patient, nurse, medical record GENERAL: His weight well today, no generalized fatigue, is eating and sleeping well SKIN: No itch, rash  See  HPI MUSCULOSKELETAL: Left ankle pain, right foot pain  NEUROLOGIC: No dizziness, fainting, headache  No change in mental status.  PSYCHIATRIC: No feelings of anxiety, depression Sleeps well.     Physical Exam Filed Vitals:   02/12/13 1303  BP: 137/85  Pulse: 102  Temp: 98.2 F (36.8 C)  Resp: 18  SpO2: 94%   GENERAL APPEARANCE: No acute distress, appropriately groomed, normal body habitus. Alert, pleasant, conversant HEAD: Normocephalic, atraumatic EYES: Conjunctiva/lids clear.  RESPIRATORY:  Breathing is nonlabored  ARTERIAL: RT.  DP pulse 2+.  VENOUS: No varicosities. No venous stasis skin changes  EDEMA: Trace LE edema.  MUSCULOSKELETAL: Left ankle immobilized in ACE wrap. Toes warm/ pink, minimlal discomfort with non weight bearing.  Right first metatarsal head ulceration with less tenderness/ erythema. There is increased exudate present.  Ulcer < 1 cm in diameter with undermining of wound edges, significant depth  NEUROLOGIC: Oriented to time, place, person. Speech clear, no tremor.  PSYCHIATRIC: Mood and affect appropriate to situation  ASSESSMENT/PLAN  Ankle fracture, left X-ray with evidence of lateral and a little malleolar were fractures, patient has been referred to orthopedics has an appointment with Dr. Prince Rome later today.  Pressure ulcer stage II Right bunion ulceration tenderness, increased exudate. Wound was cleaned and reapplied. Nurse will do daily dressing changes. One culture is pending, patient is tolerating Avelox.  Atrial fibrillation  Patient remains with irregular heart rhythm rate rate was reasonably well-controlled on current medications. Heart rate is little high today likely due to pain and anxiety related to her foot ulcer and ankle fracture. Nurse will check, heart rate daily with an apical reading, may require increased Coreg. No chest pain or any further shortness of breath. Patient was evaluated by Dr. Jacinto Halim 02/06/2013, he felt her medications were appropriate. Did recommend chest x-ray and 2D echocardiogram   Follow up: 02/14/13  Kimberly Gaines T.Kimberly Irizarry, NP-C 02/12/2013

## 2013-02-13 ENCOUNTER — Encounter: Payer: Self-pay | Admitting: Geriatric Medicine

## 2013-02-18 ENCOUNTER — Encounter: Payer: Self-pay | Admitting: Geriatric Medicine

## 2013-02-18 ENCOUNTER — Non-Acute Institutional Stay (SKILLED_NURSING_FACILITY): Payer: Medicare Other | Admitting: Geriatric Medicine

## 2013-02-18 DIAGNOSIS — L8990 Pressure ulcer of unspecified site, unspecified stage: Secondary | ICD-10-CM

## 2013-02-18 DIAGNOSIS — S82892D Other fracture of left lower leg, subsequent encounter for closed fracture with routine healing: Secondary | ICD-10-CM

## 2013-02-18 DIAGNOSIS — D509 Iron deficiency anemia, unspecified: Secondary | ICD-10-CM

## 2013-02-18 DIAGNOSIS — IMO0001 Reserved for inherently not codable concepts without codable children: Secondary | ICD-10-CM

## 2013-02-18 DIAGNOSIS — I4891 Unspecified atrial fibrillation: Secondary | ICD-10-CM

## 2013-02-18 DIAGNOSIS — L8992 Pressure ulcer of unspecified site, stage 2: Secondary | ICD-10-CM

## 2013-02-18 NOTE — Assessment & Plan Note (Addendum)
Remains with some irregularity in rhythm, rate better controlled. Continue current medications including anticoagulation. TSH, repeat dig level due tomorrow

## 2013-02-18 NOTE — Assessment & Plan Note (Signed)
Patient remains with irregular heart rhythm rate rate was reasonably well-controlled on current medications. Heart rate is little high today likely due to pain and anxiety related to her foot ulcer and ankle fracture. Nurse will check, heart rate daily with an apical reading, may require increased Coreg. No chest pain or any further shortness of breath. Patient was evaluated by Dr. Jacinto Halim 02/06/2013, he felt her medications were appropriate. Did recommend chest x-ray and 2D echocardiogram

## 2013-02-18 NOTE — Progress Notes (Signed)
Patient ID: Kimberly Gaines, female   DOB: 1919/11/04, 79 y.o.   MRN: [id number] [facility] [phone])  Code Status: Living Will, [facility]  Contact Information   Name Relation Home Work [place] A Son [phone]   Biggins,Veronica Daughter [phone]   Cyndy, Braver [phone]  [phone]      Chief Complaint  Patient presents with  . Foot wound  . Rectal Bleeding    HPI: This 79 year old female resident of [facility], Assisted Living section was transferred to the rehabilitation section August 18 due to mobility issues related to her left ankle fracture. X-ray on August 18 revealed fracture of the lateral left malleolus and medial malleolus.  Evaluated by Dr. Prince Rome on 02/12/2013. He recommends a CAM boot for immobilization, minimizing weightbearing for 2 weeks. Patient has been transferred with Patient had several episodes of bright red rectal bleeding last week. This was painless. This occurred after bowel movements. Patient reports she's not had any rectal bleeding the last 4 days.  Allergies  Allergen Reactions  . Aleve [Naproxen Sodium] Other (See Comments)    Stomach bleeding.    Medications Reviewed  Data Reviewed  Radiology Exams   [facility] 12/24/2012:   X-ray right hip: Chronic changes, osteoarthritic changes. 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 Minimal osteoarthritis. Acute fracture lateral malleolus of acute fracture knew the medial malleolus. No subluxation, dislocation or lytic destructive lesion.   Left foot x-ray, 2 views moderate hallux valgus  deformity. Minimal might minimal to mild gastroenteritis at the distal toes. No acute fracture, subluxation dislocation or lytic destructive lesion.   Right foot x-ray, 2 views: Market hallux valgus deformity. Mild osteoarthritis he diffusely. Mild soft tissue swelling first metatarsal head consistent with bunion. Tiny plantar and posterior calcaneal spurs. No acute fracture, subluxation dislocation or lytic destructive lesion. No atrophic evidence for osteomyelitis.  Cardiovascular exams 02/01/2013 EKG: Irregular rhythm, heart rate 139, no P waves, LVH  Laboratory Studies   [facility] lab, external 07/03/2012 CBC: Rbc 3.91, Hgb 10.9, Hct 33.8   Retic count 1.0   CMP: BUN 29, creatinine 1.43, otherwise normal   Lipids: Total cholesterol 186, triglycerides 84, HDL 71, LDL 98   TSH 8.017   Rheumatoid factor 329 (normal less than 14)   10/30/2012: WBC 10.8, hemoglobin 9.7, hematocrit 29.5, platelets 222   Glucose or glucose 77, BUN 32, creatinine 1.38, sodium 142, potassium 5.0 LFTs WNL albumin 3.3   TSH 0.83 12/25/2012 WBC 10.5, hemoglobin 9.54, hematacrit 28.5, platelets 570  Glucose 109, BUN 38, creatinine 2.02, sodium 138, potassium 5.1. LFTs WNL. Albumin 2.7  TSH 0.380   rheumatoid factor 315  Fe 27 01/03/2013 WBC 14.1, hgb 9.6, hct 29.2, Plt 464  Glu 88, BUN 37, Cr 1.65, Na 137, K+ 4.7 LFTs WNL. Alb 3.0  Fe 66 01/29/2013 WBC 11.0, hemoglobin 10.8, hematocrit 32.3, platelets 212.  Glucose 84, BUN 39, creatinine 1.81, sodium 143, potassium 4.4 01/31/2013: Iron 51  B12 371 08/092014: glucose 87, BUN 37, creatinine 1.62, sodium 140, potassium 4.7  Digoxin 1.2 02/05/2013 digoxin 1.1    Other:  [facility]trasound 12/24/2012 Bilateral lower extremity venous Doppler: Negative for DVT   Review of Systems  DATA OBTAINED: from patient, nurse, medical record GENERAL: His weight well today, no generalized fatigue, is eating and sleeping well SKIN: No itch, rash  See  HPI MUSCULOSKELETAL: Left ankle pain, right foot pain  NEUROLOGIC: No dizziness, fainting, headache  No change in mental status.  PSYCHIATRIC: No feelings of anxiety, depression Sleeps well.    Physical Exam Filed Vitals:   02/18/13 1137  BP: 138/73  Pulse: 82  Temp: 98.4 F (36.9 C)  Resp: 17  SpO2: 97%   GENERAL APPEARANCE: No acute distress, appropriately groomed, normal body habitus. Alert, pleasant, conversant HEAD: Normocephalic, atraumatic EYES: Conjunctiva/lids clear.  RESPIRATORY:  Breathing is nonlabored  ARTERIAL: RT.  DP pulse 2+.  VENOUS: No varicosities. No venous stasis skin changes  EDEMA: Trace LE edema.  MUSCULOSKELETAL: Left ankle immobilized in CAM boot. Toes warm/ pink,   Right first metatarsal head ulceration with less tenderness/ erythema.  Ulcer < 1 cm in diameter with less undermining of wound edges, wound base with some granulation evident, continues with significant amount of slough.  NEUROLOGIC: Oriented to time, place, person. Speech clear, no tremor.  PSYCHIATRIC: Mood and affect appropriate to situation  ASSESSMENT/PLAN  Atrial fibrillation Remains with some irregularity in rhythm, rate better controlled. Continue current medications including anticoagulation. TSH, repeat dig level due tomorrow  Pressure ulcer stage II Wound with healing evident; small amount of granulation tissue at the wound base. Continue current daily dressing regimen, will reevaluate next week  Ankle fracture, left Evaluation by Dr. Jorge Mandril 02/12/2013. He placed her in a CAM boot, with recommendation for minimal weightbearing for 2 weeks. She's to return to his office in 2-3 weeks for repeat x-ray.  PT is working with this patient for functional mobility.  Iron deficiency anemia, unspecified Several episodes of bright red rectal bleeding last week, appears to have resolved. Repeat CBC and iron level due tomorrow.  Patient goals:  Return to [place] apartment  Follow up: 1  week  Manar Smalling T.Braedin Millhouse, NP-C 02/18/2013

## 2013-02-18 NOTE — Assessment & Plan Note (Signed)
Right bunion ulceration tenderness, increased exudate. Wound was cleaned and reapplied. Nurse will do daily dressing changes. One culture is pending, patient is tolerating Avelox.

## 2013-02-18 NOTE — Assessment & Plan Note (Signed)
Wound with healing evident; small amount of granulation tissue at the wound base. Continue current daily dressing regimen, will reevaluate next week

## 2013-02-18 NOTE — Assessment & Plan Note (Signed)
X-ray with evidence of lateral and a little malleolar were fractures, patient has been referred to orthopedics has an appointment with Dr. Prince Rome later today.

## 2013-02-18 NOTE — Assessment & Plan Note (Addendum)
Evaluation by Dr. Jorge Mandril 02/12/2013. He placed her in a CAM boot, with recommendation for minimal weightbearing for 2 weeks. She's to return to his office in 2-3 weeks for repeat x-ray.  PT is working with this patient for functional mobility.

## 2013-02-18 NOTE — Assessment & Plan Note (Signed)
Several episodes of bright red rectal bleeding last week, appears to have resolved. Repeat CBC and iron level due tomorrow.

## 2013-02-28 ENCOUNTER — Ambulatory Visit (HOSPITAL_COMMUNITY): Payer: Medicare Other | Admitting: Anesthesiology

## 2013-02-28 ENCOUNTER — Encounter (HOSPITAL_COMMUNITY): Payer: Self-pay | Admitting: Anesthesiology

## 2013-02-28 ENCOUNTER — Encounter: Payer: Self-pay | Admitting: Geriatric Medicine

## 2013-02-28 ENCOUNTER — Ambulatory Visit (HOSPITAL_COMMUNITY): Payer: Medicare Other

## 2013-02-28 ENCOUNTER — Non-Acute Institutional Stay (SKILLED_NURSING_FACILITY): Payer: Medicare Other | Admitting: Geriatric Medicine

## 2013-02-28 ENCOUNTER — Encounter (HOSPITAL_COMMUNITY)
Admission: AD | Disposition: A | Discharge status: Skilled Nursing Facility | Payer: Self-pay | Source: Ambulatory Visit | Attending: Orthopaedic Surgery

## 2013-02-28 ENCOUNTER — Encounter (HOSPITAL_COMMUNITY): Payer: Self-pay | Admitting: *Deleted

## 2013-02-28 ENCOUNTER — Observation Stay (HOSPITAL_COMMUNITY)
Admission: AD | Admit: 2013-02-28 | Discharge: 2013-03-02 | Disposition: A | Discharge status: Skilled Nursing Facility | Payer: Medicare Other | Source: Ambulatory Visit | Attending: Orthopaedic Surgery | Admitting: Orthopaedic Surgery

## 2013-02-28 ENCOUNTER — Other Ambulatory Visit (HOSPITAL_COMMUNITY): Payer: Self-pay | Admitting: Orthopaedic Surgery

## 2013-02-28 DIAGNOSIS — X58XXXA Exposure to other specified factors, initial encounter: Secondary | ICD-10-CM | POA: Insufficient documentation

## 2013-02-28 DIAGNOSIS — S42209A Unspecified fracture of upper end of unspecified humerus, initial encounter for closed fracture: Secondary | ICD-10-CM

## 2013-02-28 DIAGNOSIS — R609 Edema, unspecified: Secondary | ICD-10-CM | POA: Insufficient documentation

## 2013-02-28 DIAGNOSIS — L8992 Pressure ulcer of unspecified site, stage 2: Secondary | ICD-10-CM

## 2013-02-28 DIAGNOSIS — IMO0001 Reserved for inherently not codable concepts without codable children: Secondary | ICD-10-CM

## 2013-02-28 DIAGNOSIS — S82853A Displaced trimalleolar fracture of unspecified lower leg, initial encounter for closed fracture: Principal | ICD-10-CM | POA: Insufficient documentation

## 2013-02-28 DIAGNOSIS — M810 Age-related osteoporosis without current pathological fracture: Secondary | ICD-10-CM | POA: Insufficient documentation

## 2013-02-28 DIAGNOSIS — L8990 Pressure ulcer of unspecified site, unspecified stage: Secondary | ICD-10-CM

## 2013-02-28 DIAGNOSIS — I10 Essential (primary) hypertension: Secondary | ICD-10-CM | POA: Insufficient documentation

## 2013-02-28 DIAGNOSIS — Z79899 Other long term (current) drug therapy: Secondary | ICD-10-CM | POA: Insufficient documentation

## 2013-02-28 DIAGNOSIS — S82892D Other fracture of left lower leg, subsequent encounter for closed fracture with routine healing: Secondary | ICD-10-CM

## 2013-02-28 HISTORY — PX: EXTERNAL FIXATION LEG: SHX1549

## 2013-02-28 LAB — BASIC METABOLIC PANEL
BUN: 37 mg/dL — ABNORMAL HIGH (ref 6–23)
CO2: 21 mEq/L (ref 19–32)
Calcium: 9.3 mg/dL (ref 8.4–10.5)
GFR calc non Af Amer: 31 mL/min — ABNORMAL LOW (ref >90)
Glucose, Bld: 124 mg/dL — ABNORMAL HIGH (ref 70–99)

## 2013-02-28 LAB — CBC
HCT: 35.2 % — ABNORMAL LOW (ref 36.0–46.0)
Hemoglobin: 11.1 g/dL — ABNORMAL LOW (ref 12.0–15.0)
MCH: 29.9 pg (ref 26.0–34.0)
MCHC: 31.5 g/dL (ref 30.0–36.0)
MCV: 94.9 fL (ref 78.0–100.0)

## 2013-02-28 SURGERY — EXTERNAL FIXATION, LOWER EXTREMITY
Anesthesia: General | Site: Ankle | Laterality: Left | Wound class: Clean Contaminated

## 2013-02-28 MED ORDER — LEVOTHYROXINE SODIUM 100 MCG PO TABS
100.0000 ug | ORAL_TABLET | Freq: Every day | ORAL | Status: DC
  Administered 2013-03-01 – 2013-03-02 (×2): 100 ug via ORAL
  Filled 2013-02-28 (×4): qty 1

## 2013-02-28 MED ORDER — DIPHENHYDRAMINE HCL 12.5 MG/5ML PO ELIX: 25.0000 mg | ORAL_SOLUTION | ORAL | Status: DC | PRN

## 2013-02-28 MED ORDER — SPIRONOLACTONE 25 MG PO TABS
25.0000 mg | ORAL_TABLET | Freq: Every morning | ORAL | Status: DC
Start: 2013-03-01 — End: 2013-03-02
  Administered 2013-03-01 – 2013-03-02 (×2): 25 mg via ORAL
  Filled 2013-02-28 (×2): qty 1

## 2013-02-28 MED ORDER — LACTATED RINGERS IV SOLN: INTRAVENOUS | Status: DC

## 2013-02-28 MED ORDER — CEFAZOLIN SODIUM-DEXTROSE 2-3 GM-% IV SOLR
INTRAVENOUS | Status: AC
  Filled 2013-02-28: qty 50

## 2013-02-28 MED ORDER — FERROUS SULFATE 325 (65 FE) MG PO TABS
325.0000 mg | ORAL_TABLET | Freq: Every day | ORAL | Status: DC
  Administered 2013-03-01 – 2013-03-02 (×2): 325 mg via ORAL
  Filled 2013-02-28 (×4): qty 1

## 2013-02-28 MED ORDER — SORBITOL 70 % SOLN
30.0000 mL | Freq: Every day | Status: DC | PRN
  Filled 2013-02-28: qty 30

## 2013-02-28 MED ORDER — ACETAMINOPHEN 500 MG PO TABS: 500.0000 mg | ORAL_TABLET | Freq: Four times a day (QID) | ORAL | Status: DC | PRN

## 2013-02-28 MED ORDER — FENTANYL CITRATE 0.05 MG/ML IJ SOLN
25.0000 ug | INTRAMUSCULAR | Status: DC | PRN
  Administered 2013-02-28: 25 ug via INTRAVENOUS
  Administered 2013-02-28: 50 ug via INTRAVENOUS
  Administered 2013-02-28: 25 ug via INTRAVENOUS

## 2013-02-28 MED ORDER — ACETAMINOPHEN 325 MG PO TABS: 650.0000 mg | ORAL_TABLET | Freq: Three times a day (TID) | ORAL | Status: DC

## 2013-02-28 MED ORDER — VITAMIN C 500 MG PO TABS
500.0000 mg | ORAL_TABLET | Freq: Two times a day (BID) | ORAL | Status: DC
  Administered 2013-02-28 – 2013-03-02 (×4): 500 mg via ORAL
  Filled 2013-02-28 (×5): qty 1

## 2013-02-28 MED ORDER — METOCLOPRAMIDE HCL 5 MG/ML IJ SOLN: 10.0000 mg | Freq: Once | INTRAMUSCULAR | Status: DC | PRN

## 2013-02-28 MED ORDER — CARBOXYMETHYLCELLULOSE SODIUM 0.5 % OP SOLN: 1.0000 [drp] | Freq: Three times a day (TID) | OPHTHALMIC | Status: DC | PRN

## 2013-02-28 MED ORDER — CARVEDILOL 3.125 MG PO TABS
3.1250 mg | ORAL_TABLET | Freq: Two times a day (BID) | ORAL | Status: DC
  Administered 2013-03-01 – 2013-03-02 (×3): 3.125 mg via ORAL
  Filled 2013-02-28 (×6): qty 1

## 2013-02-28 MED ORDER — METOCLOPRAMIDE HCL 5 MG/ML IJ SOLN: 5.0000 mg | Freq: Three times a day (TID) | INTRAMUSCULAR | Status: DC | PRN

## 2013-02-28 MED ORDER — MELOXICAM 15 MG PO TABS
15.0000 mg | ORAL_TABLET | Freq: Every day | ORAL | Status: DC
  Administered 2013-03-01 – 2013-03-02 (×2): 15 mg via ORAL
  Filled 2013-02-28 (×2): qty 1

## 2013-02-28 MED ORDER — MORPHINE SULFATE 2 MG/ML IJ SOLN
1.0000 mg | INTRAMUSCULAR | Status: DC | PRN
  Administered 2013-03-01: 1 mg via INTRAVENOUS
  Filled 2013-02-28: qty 1

## 2013-02-28 MED ORDER — LIDOCAINE HCL (CARDIAC) 20 MG/ML IV SOLN
INTRAVENOUS | Status: DC | PRN
  Administered 2013-02-28: 50 mg via INTRAVENOUS

## 2013-02-28 MED ORDER — CALCIUM CARBONATE-VITAMIN D 600-400 MG-UNIT PO TABS: 1.0000 | ORAL_TABLET | Freq: Two times a day (BID) | ORAL | Status: DC

## 2013-02-28 MED ORDER — APIXABAN 2.5 MG PO TABS
2.5000 mg | ORAL_TABLET | Freq: Two times a day (BID) | ORAL | Status: DC
  Administered 2013-03-01 – 2013-03-02 (×3): 2.5 mg via ORAL
  Filled 2013-02-28 (×5): qty 1

## 2013-02-28 MED ORDER — ADULT MULTIVITAMIN W/MINERALS CH
1.0000 | ORAL_TABLET | Freq: Every morning | ORAL | Status: DC
  Administered 2013-03-01 – 2013-03-02 (×2): 1 via ORAL
  Filled 2013-02-28 (×2): qty 1

## 2013-02-28 MED ORDER — SUCCINYLCHOLINE CHLORIDE 20 MG/ML IJ SOLN
INTRAMUSCULAR | Status: DC | PRN
  Administered 2013-02-28: 8 mg via INTRAVENOUS

## 2013-02-28 MED ORDER — POLYVINYL ALCOHOL 1.4 % OP SOLN
1.0000 [drp] | OPHTHALMIC | Status: DC | PRN
  Filled 2013-02-28: qty 15

## 2013-02-28 MED ORDER — LOSARTAN POTASSIUM 50 MG PO TABS
50.0000 mg | ORAL_TABLET | Freq: Every morning | ORAL | Status: DC
  Administered 2013-03-01 – 2013-03-02 (×2): 50 mg via ORAL
  Filled 2013-02-28 (×3): qty 1

## 2013-02-28 MED ORDER — POLYETHYLENE GLYCOL 3350 17 GM/SCOOP PO POWD: 1.0000 | Freq: Once | ORAL | Status: DC

## 2013-02-28 MED ORDER — 0.9 % SODIUM CHLORIDE (POUR BTL) OPTIME
TOPICAL | Status: DC | PRN
  Administered 2013-02-28: 1000 mL

## 2013-02-28 MED ORDER — DIGOXIN 125 MCG PO TABS
0.1250 mg | ORAL_TABLET | Freq: Every day | ORAL | Status: DC
Start: 2013-03-01 — End: 2013-03-02
  Administered 2013-03-01 – 2013-03-02 (×2): 0.125 mg via ORAL
  Filled 2013-02-28 (×2): qty 1

## 2013-02-28 MED ORDER — PROPOFOL 10 MG/ML IV BOLUS
INTRAVENOUS | Status: DC | PRN
  Administered 2013-02-28: 100 mg via INTRAVENOUS

## 2013-02-28 MED ORDER — METOCLOPRAMIDE HCL 5 MG PO TABS: 5.0000 mg | ORAL_TABLET | Freq: Three times a day (TID) | ORAL | Status: DC | PRN

## 2013-02-28 MED ORDER — CALCIUM CARBONATE-VITAMIN D 500-200 MG-UNIT PO TABS
1.0000 | ORAL_TABLET | Freq: Every day | ORAL | Status: DC
  Administered 2013-03-01 – 2013-03-02 (×2): 1 via ORAL
  Filled 2013-02-28 (×4): qty 1

## 2013-02-28 MED ORDER — OXYCODONE HCL 5 MG PO TABS: 5.0000 mg | ORAL_TABLET | ORAL | Status: DC | PRN

## 2013-02-28 MED ORDER — MAGNESIUM CITRATE PO SOLN: 1.0000 | Freq: Once | ORAL | Status: AC | PRN

## 2013-02-28 MED ORDER — PREDNISONE 20 MG PO TABS
20.0000 mg | ORAL_TABLET | Freq: Every day | ORAL | Status: DC
  Administered 2013-03-01 – 2013-03-02 (×2): 20 mg via ORAL
  Filled 2013-02-28 (×4): qty 1

## 2013-02-28 MED ORDER — CEFAZOLIN SODIUM-DEXTROSE 2-3 GM-% IV SOLR
2.0000 g | INTRAVENOUS | Status: AC
  Administered 2013-02-28: 2 g via INTRAVENOUS

## 2013-02-28 MED ORDER — ONDANSETRON HCL 4 MG PO TABS: 4.0000 mg | ORAL_TABLET | Freq: Four times a day (QID) | ORAL | Status: DC | PRN

## 2013-02-28 MED ORDER — PANTOPRAZOLE SODIUM 40 MG PO TBEC
40.0000 mg | DELAYED_RELEASE_TABLET | Freq: Every morning | ORAL | Status: DC
  Administered 2013-03-01 – 2013-03-02 (×2): 40 mg via ORAL
  Filled 2013-02-28 (×2): qty 1

## 2013-02-28 MED ORDER — SODIUM CHLORIDE 0.9 % IV SOLN
INTRAVENOUS | Status: DC
  Administered 2013-02-28 – 2013-03-01 (×2): via INTRAVENOUS

## 2013-02-28 MED ORDER — HYDROCODONE-ACETAMINOPHEN 5-325 MG PO TABS
1.0000 | ORAL_TABLET | ORAL | Status: DC | PRN
  Administered 2013-03-02: 1 via ORAL
  Filled 2013-02-28: qty 1

## 2013-02-28 MED ORDER — SENNA 8.6 MG PO TABS
1.0000 | ORAL_TABLET | Freq: Two times a day (BID) | ORAL | Status: DC
  Administered 2013-02-28 – 2013-03-02 (×4): 8.6 mg via ORAL
  Filled 2013-02-28 (×5): qty 1

## 2013-02-28 MED ORDER — DOCUSATE SODIUM 100 MG PO CAPS
100.0000 mg | ORAL_CAPSULE | Freq: Every day | ORAL | Status: DC
  Administered 2013-02-28 – 2013-03-02 (×3): 100 mg via ORAL
  Filled 2013-02-28 (×3): qty 1

## 2013-02-28 MED ORDER — FENTANYL CITRATE 0.05 MG/ML IJ SOLN
INTRAMUSCULAR | Status: AC
  Filled 2013-02-28: qty 2

## 2013-02-28 MED ORDER — ONDANSETRON HCL 4 MG/2ML IJ SOLN: 4.0000 mg | Freq: Four times a day (QID) | INTRAMUSCULAR | Status: DC | PRN

## 2013-02-28 MED ORDER — FENTANYL CITRATE 0.05 MG/ML IJ SOLN
INTRAMUSCULAR | Status: DC | PRN
  Administered 2013-02-28: 50 ug via INTRAVENOUS

## 2013-02-28 MED ORDER — POLYETHYLENE GLYCOL 3350 17 G PO PACK: 17.0000 g | PACK | Freq: Every day | ORAL | Status: DC | PRN

## 2013-02-28 MED ORDER — CEFAZOLIN SODIUM 1-5 GM-% IV SOLN
1.0000 g | Freq: Four times a day (QID) | INTRAVENOUS | Status: AC
  Administered 2013-02-28 – 2013-03-01 (×3): 1 g via INTRAVENOUS
  Filled 2013-02-28 (×4): qty 50

## 2013-02-28 MED ORDER — LACTATED RINGERS IV SOLN
INTRAVENOUS | Status: DC | PRN
  Administered 2013-02-28: 18:00:00 via INTRAVENOUS

## 2013-02-28 SURGICAL SUPPLY — 47 items
5x40 half pin ×2 IMPLANT
BANDAGE GAUZE ELAST BULKY 4 IN (GAUZE/BANDAGES/DRESSINGS) ×1 IMPLANT
BAR EXFIX SM BONE 10.5X250 (Trauma) ×2 IMPLANT
BAR EXFIX SM BONE 10.5X300 (Trauma) ×2 IMPLANT
BIT DRILL 3.5 SHOFT HALF PIN (BIT) ×1 IMPLANT
CATH URET WHISTLE 8FR 331008 (CATHETERS) ×1 IMPLANT
CLAMP BAR TO BAR (Clamp) ×2 IMPLANT
CLAMP PIN TO BAR (Clamp) ×6 IMPLANT
CLOTH BEACON ORANGE TIMEOUT ST (SAFETY) ×2 IMPLANT
COVER SURGICAL LIGHT HANDLE (MISCELLANEOUS) ×2 IMPLANT
Calcaneal pin (Pin) ×1 IMPLANT
DRAPE C-ARM 42X72 X-RAY (DRAPES) ×2 IMPLANT
DRAPE ORTHO SPLIT 77X108 STRL (DRAPES) ×4
DRAPE SURG 17X11 SM STRL (DRAPES) ×2 IMPLANT
DRAPE SURG ORHT 6 SPLT 77X108 (DRAPES) ×2 IMPLANT
DRAPE U-SHAPE 47X51 STRL (DRAPES) ×1 IMPLANT
DRSG PAD ABDOMINAL 8X10 ST (GAUZE/BANDAGES/DRESSINGS) ×1 IMPLANT
DURAPREP 26ML APPLICATOR (WOUND CARE) ×3 IMPLANT
ELECT REM PT RETURN 9FT ADLT (ELECTROSURGICAL) ×2
ELECTRODE REM PT RTRN 9FT ADLT (ELECTROSURGICAL) IMPLANT
GAUZE XEROFORM 5X9 LF (GAUZE/BANDAGES/DRESSINGS) ×2 IMPLANT
GLOVE BIO SURGEON STRL SZ8 (GLOVE) ×1 IMPLANT
GLOVE BIOGEL PI ORTHO PRO 7.5 (GLOVE) ×1
GLOVE ECLIPSE 7.5 STRL STRAW (GLOVE) ×1 IMPLANT
GLOVE ORTHO TXT STRL SZ7.5 (GLOVE) ×2 IMPLANT
GLOVE PI ORTHO PRO STRL 7.5 (GLOVE) IMPLANT
GOWN PREVENTION PLUS LG XLONG (DISPOSABLE) IMPLANT
GOWN PREVENTION PLUS XLARGE (GOWN DISPOSABLE) ×2 IMPLANT
GOWN STRL NON-REIN LRG LVL3 (GOWN DISPOSABLE) ×3 IMPLANT
KIT BASIN OR (CUSTOM PROCEDURE TRAY) ×2 IMPLANT
KIT ROOM TURNOVER OR (KITS) ×2 IMPLANT
MANIFOLD NEPTUNE II (INSTRUMENTS) ×2 IMPLANT
NS IRRIG 1000ML POUR BTL (IV SOLUTION) ×2 IMPLANT
PACK ORTHO EXTREMITY (CUSTOM PROCEDURE TRAY) ×2 IMPLANT
PAD ARMBOARD 7.5X6 YLW CONV (MISCELLANEOUS) ×4 IMPLANT
PADDING CAST COTTON 6X4 STRL (CAST SUPPLIES) ×1 IMPLANT
PIN HALF 5X40 (EXFIX) ×2 IMPLANT
PIN TRACTION 5X50 (EXFIX) ×1 IMPLANT
SPONGE GAUZE 4X4 12PLY (GAUZE/BANDAGES/DRESSINGS) ×1 IMPLANT
SPONGE LAP 18X18 X RAY DECT (DISPOSABLE) ×2 IMPLANT
SUT ETHILON 2 0 FS 18 (SUTURE) ×1 IMPLANT
SUT VIC AB 2-0 CT1 27 (SUTURE)
SUT VIC AB 2-0 CT1 TAPERPNT 27 (SUTURE) IMPLANT
TOWEL OR 17X24 6PK STRL BLUE (TOWEL DISPOSABLE) ×2 IMPLANT
TOWEL OR 17X26 10 PK STRL BLUE (TOWEL DISPOSABLE) ×2 IMPLANT
UNDERPAD 30X30 INCONTINENT (UNDERPADS AND DIAPERS) ×2 IMPLANT
WATER STERILE IRR 1000ML POUR (IV SOLUTION) ×2 IMPLANT

## 2013-02-28 NOTE — Anesthesia Postprocedure Evaluation (Signed)
Anesthesia Post Note  Patient: Kimberly Gaines  Procedure(s) Performed: Procedure(s) (LRB): EXTERNAL FIXATION LEFT ANKLE (Left)  Anesthesia type: general  Patient location: PACU  Post pain: Pain level controlled  Post assessment: Patient's Cardiovascular Status Stable  Last Vitals:  Filed Vitals:   02/28/13 2045  BP: 169/88  Pulse: 74  Temp:   Resp: 13    Post vital signs: Reviewed and stable  Level of consciousness: sedated  Complications: No apparent anesthesia complications

## 2013-02-28 NOTE — Op Note (Signed)
Date of Surgery: 02/28/2013  INDICATIONS: Kimberly Gaines is a 79 y.o.-year-old female who sustained a trimalleolar ankle fracture three weeks ago; she was indicated for external fixation due to the displaced and unstable nature of the fracture and came to the operating room today for this procedure.  Her swelling was also severe enough that the risk for potential wound complications would be significant if ORIF was attempted.  The patient and family did consent to the procedure after discussion of the risks and benefits.   PREOPERATIVE DIAGNOSIS: right trimalleolar ankle fracture   POSTOPERATIVE DIAGNOSIS: Same.  PROCEDURE: External fixation right ankle fracture CPT 20690 uniplane  SURGEON: N. Glee Arvin, M.D.  ANESTHESIA: general  IV FLUIDS AND URINE: See anesthesia.  ESTIMATED BLOOD LOSS: minimal mL.  IMPLANTS: [employer] external fixator  DRAINS: None.  COMPLICATIONS: None.  DESCRIPTION OF PROCEDURE: The patient was brought to the operating room and placed supine on the operating table.  The patient had been signed prior to the procedure.  The patient had the anesthesia placed by the anesthesiologist.  The prep verification and incision time-outs were performed to confirm that this was the correct patient, site, side and location. The patient had SCDs in place on the opposite lower extremity. The patient did receive antibiotics prior to the incision and was re-dosed during the procedure as needed at indicated intervals.  The lower extremity was prepped and draped in the standard fashion.  The bony landmarks were palpated and the pin sites were marked on the skin. Each Schanz pin was placed in the same fashion -- first drilling with the 3.5 mm drill while copiously irrigating, then hand placing the pin.  This was confirmed on x-ray on both views.  The ex-fix clamps were placed onto pins and the fracture was pulled into the proper alignment.  The clamps were completely tightened.   Final  x-rays were taken in AP and lateral views to confirm the reduction and pin lengths. The wounds were cleaned and dried a final time and a sterile dressing consisting of Xeroform and ABDs was placed. The patient was then transferred back to the bed and left the operating room in stable condition.  All sponge and instrument counts were correct.  POSTOPERATIVE PLAN: Kimberly Gaines will remain non weight bearing with the leg elevated.  she will get a CT scan to fully evaluate the extent of her injury.  We will plan on treating her fracture definitely in the external fixator.  Pin site care will be initiated on postoperative day one.

## 2013-02-28 NOTE — Progress Notes (Signed)
Orthopedic Tech Progress Note Patient Details:  Kimberly Gaines Sep 17, 1919 [id number]  Ortho Devices Ortho Device/Splint Location: applied overhead frame to bed Ortho Device/Splint Interventions: Ordered;Application   Jennye Moccasin 02/28/2013, 10:24 PM

## 2013-02-28 NOTE — Preoperative (Signed)
Beta Blockers   Reason not to administer Beta Blockers:Not Applicable 

## 2013-02-28 NOTE — Assessment & Plan Note (Signed)
Remains immobilized in a CAM boot. Patient is for orthopedic reevaluation today.

## 2013-02-28 NOTE — Transfer of Care (Signed)
Immediate Anesthesia Transfer of Care Note  Patient: Kimberly Gaines  Procedure(s) Performed: Procedure(s): EXTERNAL FIXATION LEFT ANKLE (Left)  Patient Location: PACU  Anesthesia Type:General  Level of Consciousness: awake and alert   Airway & Oxygen Therapy: Patient Spontanous Breathing and Patient connected to nasal cannula oxygen  Post-op Assessment: Report given to PACU RN and Post -op Vital signs reviewed and stable  Post vital signs: Reviewed and stable  Complications: No apparent anesthesia complications

## 2013-02-28 NOTE — Assessment & Plan Note (Signed)
Wound is clean, but appears to be a healing appears to be stopped. Treated today with silver nitrate to stimulate granulation tissue. Continue daily dressing change, will reassess next week

## 2013-02-28 NOTE — Progress Notes (Addendum)
Spoke with Darl Pikes nurse taking care of pt today;states pt ate a big breakfast at 0830 this morning--2 eggs/pancakes/bacon;at 1200 states she had 2 Ritz Bitz crackers but nothing to drink with it.Also some apple sauce with pills at noon.

## 2013-02-28 NOTE — Anesthesia Preprocedure Evaluation (Signed)
Anesthesia Evaluation  Patient identified by MRN, date of birth, ID band Patient awake    Reviewed: Allergy & Precautions, H&P , NPO status , Patient's Chart, lab work & pertinent test results, reviewed documented beta blocker date and time   Airway Mallampati: II TM Distance: >3 FB Neck ROM: full    Dental   Pulmonary neg pulmonary ROS,  breath sounds clear to auscultation        Cardiovascular hypertension, Pt. on home beta blockers and Pt. on medications + dysrhythmias Atrial Fibrillation Rhythm:regular     Neuro/Psych negative neurological ROS  negative psych ROS   GI/Hepatic negative GI ROS, Neg liver ROS,   Endo/Other  negative endocrine ROSHypothyroidism   Renal/GU negative Renal ROS  negative genitourinary   Musculoskeletal  (+) Arthritis -,   Abdominal   Peds  Hematology negative hematology ROS (+) anemia ,   Anesthesia Other Findings See surgeon's H&P   Reproductive/Obstetrics negative OB ROS                           Anesthesia Physical Anesthesia Plan  ASA: II  Anesthesia Plan: General   Post-op Pain Management:    Induction: Intravenous, Rapid sequence and Cricoid pressure planned  Airway Management Planned: Oral ETT  Additional Equipment:   Intra-op Plan:   Post-operative Plan: Extubation in OR  Informed Consent: I have reviewed the patients History and Physical, chart, labs and discussed the procedure including the risks, benefits and alternatives for the proposed anesthesia with the patient or authorized representative who has indicated his/her understanding and acceptance.   Dental Advisory Given  Plan Discussed with: CRNA and Surgeon  Anesthesia Plan Comments:         Anesthesia Quick Evaluation

## 2013-02-28 NOTE — Progress Notes (Signed)
Patient ID: Kimberly Gaines, female   DOB: 1920/02/21, 79 y.o.   MRN: [id number] [facility] [phone])  Code Status: Living Will, [facility]  Contact Information   Name Relation Home Work Allyn A Son [phone]   Biggins,Veronica Daughter [phone]   Hinley, Brimage [phone]  [phone]      Chief Complaint  Patient presents with  . foot wound  . Leg Swelling    HPI: This 79 year old female resident of [facility], Assisted Living section was transferred to the rehabilitation section August 18 due to mobility issues related to her left ankle fracture. X-ray on August 18 revealed fracture of the lateral left malleolus and medial malleolus.   Evaluated by Dr. Prince Rome on 02/12/2013. He recommended a CAM boot for immobilization, minimal weightbearing for 2-3 weeks, with PT interventions for maintenance of stregth. Patient has been transferring with stand up lift. Has some discomfort in left ankle with any weight bearing. Patient is returning to Dr. Prince Rome today for repeat X-ray.  Pressure ulcer on her right bunion with cellulitis, treated with Avelox. Cellulitis resolved, at last visit wound with healing evident; small amount of granulation tissue at the base, small amount undermining at distal portion.  Nurse reports increased bilateral lower extremity edema this week. Does note the patient has been spending increased amount of time in her motorized wheelchair with her feet in a dependent position.  Allergies  Allergen Reactions  . Aleve [Naproxen Sodium] Other (See Comments)    Stomach bleeding.    Medications Reviewed  Data Reviewed  Radiology Exams   [facility] X-Ray 12/24/2012:   X-ray right hip: Chronic changes, osteoarthritic changes. No evidence of acute pathology  X-ray right femur no acute pathology  X-ray right knee normal right prosthetic  knee  X-ray right tibia/fibula: No radiographic evidence of acute pathology  X-ray right ankle no acute pathology  02/11/2013 Left ankle x-ray, 2 views: Moderate diffuse soft tissue swelling seen diffusely. Minimal osteoarthritis. Acute fracture lateral malleolus of acute fracture knew the medial malleolus. No subluxation, dislocation or lytic destructive lesion.   Left foot x-ray, 2 views moderate hallux valgus deformity. Minimal might minimal to mild gastroenteritis at the distal toes. No acute fracture, subluxation dislocation or lytic destructive lesion.   Right foot x-ray, 2 views: Market hallux valgus deformity. Mild osteoarthritis he diffusely. Mild soft tissue swelling first metatarsal head consistent with bunion. Tiny plantar and posterior calcaneal spurs. No acute fracture, subluxation dislocation or lytic destructive lesion. No atrophic evidence for osteomyelitis.  Cardiovascular exams 02/01/2013 EKG: Irregular rhythm, heart rate 139, no P waves, LVH  Laboratory Studies   [facility] lab, external 07/03/2012 CBC: Rbc 3.91, Hgb 10.9, Hct 33.8   Retic count 1.0   CMP: BUN 29, creatinine 1.43, otherwise normal   Lipids: Total cholesterol 186, triglycerides 84, HDL 71, LDL 98   TSH 8.017   Rheumatoid factor 329 (normal less than 14)   10/30/2012: WBC 10.8, hemoglobin 9.7, hematocrit 29.5, platelets 222   Glucose or glucose 77, BUN 32, creatinine 1.38, sodium 142, potassium 5.0 LFTs WNL albumin 3.3   TSH 0.83 12/25/2012 WBC 10.5, hemoglobin 9.54, hematacrit 28.5, platelets 570  Glucose 109, BUN 38, creatinine 2.02, sodium 138, potassium 5.1. LFTs WNL. Albumin 2.7  TSH 0.380   rheumatoid factor 315  Fe 27 01/03/2013 WBC 14.1, hgb 9.6, hct 29.2, Plt 464  Glu 88, BUN 37, Cr 1.65, Na 137, K+ 4.7 LFTs WNL. Alb 3.0  Fe 66 01/29/2013 WBC 11.0, hemoglobin 10.8, hematocrit 32.3, platelets 212.  Glucose 84, BUN 39, creatinine 1.81, sodium 143, potassium 4.4 01/31/2013: Iron 51  B12 371 8/26/  2014 WBC 10.6, hemoglobin 9.8, hematocrit 29.3, platelets 278  Glucose 85, BUN 34, creatinine 1.64, sodium 140, potassium 5.0  TSH 2.19  Digoxin 1.0    Other:  [facility] Ultrasound 12/24/2012 Bilateral lower extremity venous Doppler: Negative for DVT   Review of Systems  DATA OBTAINED: from patient, nurse, medical record GENERAL: His weight well today, no generalized fatigue, is eating and sleeping well SKIN: No itch, rash  See HPI MUSCULOSKELETAL: Left ankle pain, right foot pain  NEUROLOGIC: No dizziness, fainting, headache  No change in mental status.  PSYCHIATRIC: No feelings of anxiety, depression Sleeps well.    Physical Exam Filed Vitals:   02/28/13 1204  BP: 134/78  Pulse: 74  Weight: 162 lb 9.6 oz (73.755 kg)  SpO2: 96%   GENERAL APPEARANCE: No acute distress, appropriately groomed, normal body habitus. Alert, pleasant, conversant HEAD: Normocephalic, atraumatic EYES: Conjunctiva/lids clear.  RESPIRATORY:  Breathing is nonlabored, Lung sounds are clear/ full CARDIAC: IRRR, no murmur.  ARTERIAL: RT.  DP pulse 2+.  VENOUS: No varicosities. No venous stasis skin changes  EDEMA: 1+ Bilateral LE edema.  MUSCULOSKELETAL: Left ankle immobilized in CAM boot. Toes warm/ pink,   Right first metatarsal head ulceration with no tenderness or erythema.  Ulcer < 1 cm in diameter with  undermining of distal wound edge, wound base with small amount granulation tissue  evident, no slough, . Wound edges are smooth, pale with mild amount inversion. WOund edges / base treated with AGNO3 to stimulate granulation   NEUROLOGIC: Oriented to time, place, person. Speech clear, no tremor.  PSYCHIATRIC: Mood and affect appropriate to situation  ASSESSMENT/PLAN  Pressure ulcer stage II Wound is clean, but appears to be a healing appears to be stopped. Treated today with silver nitrate to stimulate granulation tissue. Continue daily dressing change, will reassess next week  Ankle fracture,  left Remains immobilized in a CAM boot. Patient is for orthopedic reevaluation today.  Edema Increased edema bilateral lower extremities likely due to dependent position. No overall weight gain, lung sounds remain clear. Will add low dose furosemide to augment spironolactone.  Patient is encouraged to spend some time each day with legs elevated.  Patient goals:  Return to [place] apartment  Follow up: 1 week  Avner Stroder T.Sylvana Bonk, NP-C 02/28/2013

## 2013-02-28 NOTE — H&P (Signed)
PREOPERATIVE H&P  Chief Complaint: left ankle trimalleolar fracture  HPI: Kimberly Gaines is a 79 y.o. female who presents for preoperative history and physical with a diagnosis of left ankle trimalleolar fracture. Symptoms are rated as moderate to severe, and have been worsening.  This is significantly impairing activities of daily living.  She has elected for surgical management.   Past Medical History  Diagnosis Date  . Iron deficiency anemia, unspecified   . Chronic diastolic heart failure 2012  . Unspecified constipation 2013  . 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 Edema 2012  . Reflux esophagitis 2013  . Unspecified essential hypertension 2012  . Mobitz (type) II atrioventricular block 2012  . Senile osteoporosis   . Personal history of malignant neoplasm of breast 1998    Left mastectomy  . Postmenopausal atrophic vaginitis 2014  . Senile cataract, unspecified 08/2012    Left extration/IOL implant  . Arthritis   . Osteoarthrosis, unspecified whether generalized or localized, lower leg 2008    s/p bilateral TKA  . Rheumatoid arthritis(714.0) 2013  . Pain in joint, lower leg 08/06/2012  . Dysuria 07/02/2012  . Urinary tract infection, site not specified 06/18/2012  . Reflux esophagitis 04/16/2012  . Pain in joint, forearm 12/26/2011  . Impacted cerumen 03/09/2011  . Anemia of other chronic disease 02/07/2011  . Urinary frequency 11/03/2010  . Pain in joint, site unspecified 07/20/2010  . Encounter for long-term (current) use of other medications 263.8    07/17/2010  . Other specified hypotension 07/17/2010  . Unspecified adverse effect of other drug, medicinal and biological substance(995.29) 07/17/2010  . Debility, unspecified 07/16/2010  . Dizziness and giddiness 07/17/2005  . Fracture, humerus 09/29/12    proximal left  . Unspecified hypothyroidism 2012  . Atrial fibrillation 02/01/2013   Past Surgical History  Procedure  Laterality Date  . Appendectomy  1978  . Abdominal hysterectomy  1978  . Mastectomy Left 1998  . Cataract extraction w/ intraocular lens  implant, bilateral  2956,2130    Rt 2003, left 2014  . Joint replacement Bilateral     Knees  . Breast surgery Left 1998    mastectomy  . Eye surgery Right 2003    cataract extraction/IOL implant   History   Social History  . Marital Status: Widowed    Spouse Name: N/A    Number of Children: N/A  . Years of Education: N/A   Social History Main Topics  . Smoking status: Never Smoker   . Smokeless tobacco: Never Used  . Alcohol Use: Yes     Comment: occ  . Drug Use: No  . Sexual Activity: None   Other Topics Concern  . None   Social History Narrative   Widow. Resides in Independent Living apartment at [name] since 2005. 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 No smoking history, minimal alcohol, intake. Stopped driving several years ago.    Family History  Problem Relation Age of Onset  . Pneumonia Brother    Allergies  Allergen Reactions  . Aleve [Naproxen Sodium] Other (See Comments)    Stomach bleeding.    Prior to Admission medications   Medication Sig Start Date End Date Taking? Authorizing Provider  acetaminophen (TYLENOL) 325 MG tablet Take 650 mg by mouth 3 (three) times daily.   Yes Historical Provider, MD  acetaminophen (TYLENOL) 500 MG tablet Take 500 mg by mouth every 6 (six) hours as needed for  pain. Take 1 tablet as needed for pain relief.   Yes Historical Provider, MD  apixaban (ELIQUIS) 2.5 MG TABS tablet Take by mouth 2 (two) times daily. 02/02/13  Yes Historical Provider, MD  Calcium Carbonate-Vitamin D (CALCIUM 600+D) 600-400 MG-UNIT per tablet Take 1 tablet by mouth 2 (two) times daily.   Yes Historical Provider, MD  carboxymethylcellulose (REFRESH PLUS) 0.5 % SOLN 1 drop 3 (three) times daily as needed.   Yes Historical Provider, MD  carvedilol (COREG) 3.125 MG tablet Take 3.125 mg by  mouth 2 (two) times daily with a meal. Take 1 tablet twice daily to treat heart failure /  HTN   Yes Historical Provider, MD  digoxin (LANOXIN) 0.125 MG tablet Take 0.125 mg by mouth daily. 02/02/13  Yes Historical Provider, MD  docusate sodium (COLACE) 100 MG capsule Take 100 mg by mouth daily. Take 1 capsule daily for stool softener.   Yes Historical Provider, MD  ferrous sulfate 325 (65 FE) MG tablet Take 325 mg by mouth daily with breakfast. Take 1 tablet daily to treat anemia.   Yes Historical Provider, MD  levothyroxine (SYNTHROID, LEVOTHROID) 100 MCG tablet Take 100 mcg by mouth daily before breakfast. Take 1 tablet daily for thyroid supplement.   Yes Historical Provider, MD  losartan (COZAAR) 50 MG tablet Take 50 mg by mouth every morning. Take 1 tablet daily to control BP/treat HF.   Yes Historical Provider, MD  meloxicam (MOBIC) 15 MG tablet Take 15 mg by mouth daily. Take 1 tablet daily to help arthritis pain. 10/25/12  Yes Historical Provider, MD  Multiple Vitamin (MULTIVITAMIN WITH MINERALS) TABS Take 1 tablet by mouth every morning. Take 1 tablet daily as a nutritional supplement.   Yes Historical Provider, MD  pantoprazole (PROTONIX) 40 MG tablet Take 40 mg by mouth every morning. Take 1 tablet daily for reflux symptoms.   Yes Historical Provider, MD  polyethylene glycol powder (GLYCOLAX/MIRALAX) powder  10/12/12  Yes Historical Provider, MD  predniSONE (DELTASONE) 20 MG tablet 3 tablets daily for 2 days, then 2 tablets daily for 7 days, then 1 tablet daily to help arthritis 01/28/13  Yes Kimber Relic, MD  spironolactone (ALDACTONE) 25 MG tablet Take 25 mg by mouth every morning. Take 1 tablet daily to treat HF.   Yes Historical Provider, MD     Positive ROS: All other systems have been reviewed and were otherwise negative with the exception of those mentioned in the HPI and as above.  Physical Exam: General: Alert, no acute distress Cardiovascular: No pedal edema Respiratory: No  cyanosis, no use of accessory musculature GI: No organomegaly, abdomen is soft and non-tender Skin: No lesions in the area of chief complaint Neurologic: Sensation intact distally Psychiatric: Patient is competent for consent with normal mood and affect Lymphatic: No axillary or cervical lymphadenopathy  MUSCULOSKELETAL:  LLE: severe swelling of the extremity; foot wwp; NVI; minimal ankle ROM due to pain and injury; sensation intact throughout  Assessment: left ankle trimalleolar fracture with subluxation almost 3 weeks out now  Plan: Plan for Procedure(s): EXTERNAL FIXATION LEFT ANKLE  The risks benefits and alternatives were discussed with the patient including but not limited to the risks of nonoperative treatment, versus surgical intervention including infection, bleeding, nerve injury,  blood clots, cardiopulmonary complications, morbidity, mortality, among others, and they were willing to proceed.   Cheral Almas, MD   02/28/2013 5:29 PM

## 2013-02-28 NOTE — Assessment & Plan Note (Signed)
Increased edema bilateral lower extremities likely due to dependent position. No overall weight gain, lung sounds remain clear. Will add low dose furosemide to augment spironolactone.  Patient is encouraged to spend some time each day with legs elevated.

## 2013-03-01 ENCOUNTER — Observation Stay (HOSPITAL_COMMUNITY): Payer: Medicare Other

## 2013-03-01 LAB — COMPREHENSIVE METABOLIC PANEL
ALT: 25 U/L (ref 0–35)
Alkaline Phosphatase: 77 U/L (ref 39–117)
CO2: 23 mEq/L (ref 19–32)
Chloride: 108 mEq/L (ref 96–112)
GFR calc Af Amer: 43 mL/min — ABNORMAL LOW (ref >90)
GFR calc non Af Amer: 37 mL/min — ABNORMAL LOW (ref >90)
Glucose, Bld: 80 mg/dL (ref 70–99)
Potassium: 4.3 mEq/L (ref 3.5–5.1)
Sodium: 142 mEq/L (ref 135–145)

## 2013-03-01 MED ORDER — OXYCODONE HCL 5 MG PO TABS: 5.0000 mg | ORAL_TABLET | ORAL | Status: DC | PRN

## 2013-03-01 MED ORDER — PROMETHAZINE HCL 25 MG PO TABS: 25.0000 mg | ORAL_TABLET | Freq: Four times a day (QID) | ORAL | Status: AC | PRN

## 2013-03-01 MED ORDER — SODIUM CHLORIDE 0.9 % IJ SOLN
3.0000 mL | Freq: Two times a day (BID) | INTRAMUSCULAR | Status: DC
  Administered 2013-03-01: 3 mL via INTRAVENOUS

## 2013-03-01 MED ORDER — SODIUM CHLORIDE 0.9 % IJ SOLN: 3.0000 mL | INTRAMUSCULAR | Status: DC | PRN

## 2013-03-01 MED ORDER — ASCORBIC ACID 500 MG PO TABS: 500.0000 mg | ORAL_TABLET | Freq: Two times a day (BID) | ORAL | Status: DC

## 2013-03-01 MED ORDER — SODIUM CHLORIDE 0.9 % IV SOLN: 250.0000 mL | INTRAVENOUS | Status: DC | PRN

## 2013-03-01 NOTE — Evaluation (Signed)
Physical Therapy Evaluation Patient Details Name: Kimberly Gaines MRN: [id number] DOB: [birth year] Today's Date: 03/01/2013 Time: 8295-6213 PT Time Calculation (min): 33 min  PT Assessment / Plan / Recommendation History of Present Illness  Kimberly Gaines is a 79 y.o.-year-old female who sustained a trimalleolar ankle fracture three weeks ago. Now s/p external fixator and NWB LLE.   Clinical Impression  POD #1. Moving relatively well given her age. Presents with below impairments impacting functional mobility and safety. Will benefit from PT in the acute setting to maximize mobility for safe transition back to SNF for rehab. May attempt slide board transfers?     PT Assessment  Patient needs continued PT services    Follow Up Recommendations  SNF    Does the patient have the potential to tolerate intense rehabilitation      Barriers to Discharge        Equipment Recommendations   (tbd at SNF)    Recommendations for Other Services     Frequency Min 3X/week    Precautions / Restrictions Precautions Precautions: Fall Restrictions Weight Bearing Restrictions: Yes LLE Weight Bearing: Non weight bearing   Pertinent Vitals/Pain Min pain and disgust when looking at ex-fix so we covered it and she felt better      Mobility  Bed Mobility Bed Mobility: Supine to Sit Supine to Sit: 4: Min assist;3: Mod assist Details for Bed Mobility Assistance: minA to support LLE, pt initiating movement well however easily exhausted and needing cues for efficiency as well as assist for follow through; use of pad to help scoot hips edge of bed Transfers Transfers: Counselling psychologist Transfer: 1: +2 Total assist Anterior-Posterior Transfers: Patient Percentage: 50% Details for Transfer Assistance: attempted sit->stand x3 however pt resistant and fearful needing totalA to maintain NWB and unable to clear buttocks from bed without heavy posterior lean; pt then  participated in AP transfer needing minA to support LLE as well as step by step sequencing cues for efficiency and assist for follow through with use of pad to scoot hips Ambulation/Gait Ambulation/Gait Assistance: Not tested (comment)    Exercises     PT Diagnosis: Generalized weakness;Acute pain;Difficulty walking  PT Problem List: Decreased strength;Decreased activity tolerance;Decreased mobility;Pain;Decreased knowledge of precautions;Decreased balance PT Treatment Interventions: DME instruction;Gait training;Functional mobility training;Therapeutic activities;Therapeutic exercise;Balance training;Patient/family education     PT Goals(Current goals can be found in the care plan section) Acute Rehab PT Goals Patient Stated Goal: independence PT Goal Formulation: With patient Time For Goal Achievement: 03/08/13 Potential to Achieve Goals: Good  Visit Information  Last PT Received On: 03/01/13 Assistance Needed: +2 History of Present Illness: Kimberly Gaines is a 79 y.o.-year-old female who sustained a trimalleolar ankle fracture three weeks ago. Now s/p external fixator and NWB LLE.        Prior Functioning  Home Living Family/patient expects to be discharged to:: Skilled nursing facility    Cognition  Cognition Arousal/Alertness: Awake/alert Behavior During Therapy: WFL for tasks assessed/performed Overall Cognitive Status: Within Functional Limits for tasks assessed    Extremity/Trunk Assessment Upper Extremity Assessment Upper Extremity Assessment: Defer to OT evaluation Lower Extremity Assessment Lower Extremity Assessment: Generalized weakness;LLE deficits/detail LLE Deficits / Details: sensation intact, grossly 2+/5 (difficulty lifting her leg because of heaviness of ex-fix) LLE: Unable to fully assess due to immobilization;Unable to fully assess due to pain   Balance    End of Session PT - End of Session Equipment Utilized During Treatment: Gait belt Activity  Tolerance: Patient tolerated treatment well  Patient left: in chair;with call bell/phone within reach Nurse Communication: Mobility status  GP Functional Limitation: Changing and maintaining body position Changing and Maintaining Body Position Current Status (Z6109): At least 60 percent but less than 80 percent impaired, limited or restricted Changing and Maintaining Body Position Goal Status (U0454): At least 20 percent but less than 40 percent impaired, limited or restricted   Kimberly Gaines 03/01/2013, 3:27 PM

## 2013-03-01 NOTE — Clinical Social Work Note (Addendum)
Clinical Social Work Department BRIEF PSYCHOSOCIAL ASSESSMENT 03/01/2013  Patient:  Kimberly Gaines, Kimberly Gaines     Account Number:  [id number]     Admit date:  02/28/2013  Clinical Social Worker:  Hulan Fray  Date/Time:  03/01/2013 10:39 AM  Referred by:  RN  Date Referred:  03/01/2013 Referred for  Other - See comment   Other Referral:   Admit from facility   Interview type:  Other - See comment Other interview type:   Left vm with son  Kimberly Gaines at [facility]  Chart review    PSYCHOSOCIAL DATA Living Status:  FACILITY Admitted from facility:  [facility] Level of care:  Skilled Nursing Facility Primary support name:  Kimberly Gaines Primary support relationship to patient:  CHILD, ADULT Degree of support available:   supportive    CURRENT CONCERNS Current Concerns  Post-Acute Placement   Other Concerns:    SOCIAL WORK ASSESSMENT / PLAN Clinical Social Worker received referral for patient being admitted from facility. CSW reviewed chart and noticed documentation regarding [facility]. CSW went by patient's room, but patient was sleeping. CSW called son, and left voice message for him to return call.    CSW was able to speak with admissions coordinator Kimberly Gaines, at [facility] and confirmed patient is a resident on their rehab section. Patient is able to return when medically stable. Facility can accept patient over weekend, if patient is medically stable.    CSW will complete FL2 for MD's signature and facilitate discharge back to facility, when medically stable.   Assessment/plan status:  Psychosocial Support/Ongoing Assessment of Needs Other assessment/ plan:   Information/referral to community resources:   Patient is from facility    PATIENT'S/FAMILY'S RESPONSE TO PLAN OF CARE: CSW left message for son to return call. Per facility, patient will be able to return back when medically stable.  10:48am CSW received call back from and son, and he verified plans  for patient to return back to [facility] when medically stable.

## 2013-03-01 NOTE — Progress Notes (Signed)
Subjective:  Patient reports pain as moderate.  No events  Objective:   VITALS:   Filed Vitals:   02/28/13 2330 03/01/13 0046 03/01/13 0509 03/01/13 0917  BP: 160/112 149/95 106/65 137/75  Pulse: 76 103 65 95  Temp: 97.7 F (36.5 C) 97.8 F (36.6 C) 97.9 F (36.6 C) 97.6 F (36.4 C)  TempSrc: Oral Oral Oral Oral  Resp: 16 18 18 20   Height:      SpO2: 96% 96% 100% 98%    Neurologically intact Neurovascular intact Sensation intact distally Intact pulses distally Incision: dressing C/D/I and scant drainage No cellulitis present Compartment soft   Lab Results  Component Value Date   WBC 12.3* 02/28/2013   HGB 11.1* 02/28/2013   HCT 35.2* 02/28/2013   MCV 94.9 02/28/2013   PLT 288 02/28/2013     Assessment/Plan: 1 Day Post-Op   Problem List Items Addressed This Visit   None      Advance diet Up with therapy Needs CT of ankle today Foot plate ordered  DVT ppx Pain control Discharge planning   Cheral Almas 03/01/2013, 1:07 PM [phone]

## 2013-03-01 NOTE — Progress Notes (Signed)
Occupational Therapy Treatment Patient Details Name: Kimberly Gaines MRN: [id number] DOB: Jun 22, 1920 Today's Date: 03/01/2013 Time: 1453-1510 OT Time Calculation (min): 17 min  OT Assessment / Plan / Recommendation   ADL  ADL Comments: Session- focus was to apply foot plate. Pt provided 4 inch foam strap at metatarsals with ~ 1/2 inch overlay on lateral aspects of foot. Pt provided 2 inch foam straping to attach foot plate to external fixator. Velcro was applied to two bars of external fixator with 2 inch straps secured. Pt is able to achieve a 90 degree ankle flexion position. RN Okey Regal called to room and notified that strap should be checked x2 daily with dressing change. If redness or break down are noted OT should be contacted strap removed.    OT Diagnosis:    OT Problem List:   OT Treatment Interventions:       Prior Functioning  Home Living Family/patient expects to be discharged to:: Skilled nursing facility    Cognition  Cognition Arousal/Alertness: Awake/alert Behavior During Therapy: WFL for tasks assessed/performed Overall Cognitive Status: Within Functional Limits for tasks assessed    Mobility  Bed Mobility Bed Mobility: Supine to Sit Supine to Sit: 4: Min assist;3: Mod assist Details for Bed Mobility Assistance: minA to support LLE, pt initiating movement well however easily exhausted and needing cues for efficiency as well as assist for follow through; use of pad to help scoot hips edge of bed Transfers Details for Transfer Assistance: attempted sit->stand x3 however pt resistant and fearful needing totalA to maintain NWB and unable to clear buttocks from bed without heavy posterior lean; pt then participated in AP transfer needing minA to support LLE as well as step by step sequencing cues for efficiency and assist for follow through with use of pad to scoot hips              End of Session OT - End of Session Activity Tolerance: Patient tolerated  treatment well Patient left: in chair;with call bell/phone within reach  GO     Harolyn Rutherford 03/01/2013, 3:29 PM Pager: [phone]

## 2013-03-01 NOTE — Evaluation (Signed)
Occupational Therapy Evaluation Patient Details Name: Kimberly Gaines MRN: [id number] DOB: 1920/05/20 Today's Date: 03/01/2013 Time: 3295-1884 OT Time Calculation (min): 33 min  OT Assessment / Plan / Recommendation History of present illness Kimberly Gaines is a 79 y.o.-year-old female who sustained a trimalleolar ankle fracture three weeks ago. Now s/p external fixator and NWB LLE.    Clinical Impression   Pt admitted with above. Will continue to follow pt acutely in order to address below problem list. Recommending SNF for d/c planning.    OT Assessment  Patient needs continued OT Services    Follow Up Recommendations  SNF    Barriers to Discharge      Equipment Recommendations   (TBD)    Recommendations for Other Services    Frequency  Min 2X/week    Precautions / Restrictions Precautions Precautions: Fall Restrictions Weight Bearing Restrictions: Yes LLE Weight Bearing: Non weight bearing   Pertinent Vitals/Pain See vitals    ADL  Eating/Feeding: Performed;Modified independent Where Assessed - Eating/Feeding: Chair Grooming: Performed;Brushing hair;Set up Where Assessed - Grooming: Unsupported sitting Upper Body Bathing: Simulated;Supervision/safety;Set up Where Assessed - Upper Body Bathing: Unsupported sitting Lower Body Bathing: Simulated;+2 Total assistance Where Assessed - Lower Body Bathing: Lean right and/or left;Supported sitting Upper Body Dressing: Simulated;Set up;Supervision/safety Where Assessed - Upper Body Dressing: Unsupported sitting Lower Body Dressing: Performed;+2 Total assistance Where Assessed - Lower Body Dressing: Supported sitting;Lean right and/or left Toilet Transfer: Simulated;+2 Total assistance Toilet Transfer: Patient Percentage: 30% Toilet Transfer Method: Anterior-posterior Acupuncturist:  (bed<>recliner) Equipment Used: Gait belt Transfers/Ambulation Related to ADLs: Attempted sit<>stand x3 but pt very fearful  and unable to complete transfer.  Pt transferred from bed<>chair with +2 total assist using A-P technique. ADL Comments: Pt sat EOB and performed grooming tasks. Very fearful and hesitant when attempting sit<>stand while attempting to maintain NWB status therefore used A-P transfer to get into chair.    OT Diagnosis: Generalized weakness;Acute pain  OT Problem List: Decreased strength;Decreased activity tolerance;Impaired balance (sitting and/or standing);Decreased knowledge of use of DME or AE;Decreased knowledge of precautions;Pain OT Treatment Interventions: Self-care/ADL training;DME and/or AE instruction;Therapeutic activities;Balance training;Patient/family education   OT Goals(Current goals can be found in the care plan section) Acute Rehab OT Goals Patient Stated Goal: independence OT Goal Formulation: With patient Time For Goal Achievement: 03/15/13 Potential to Achieve Goals: Good  Visit Information  Last OT Received On: 03/01/13 Assistance Needed: +2 PT/OT Co-Evaluation/Treatment: Yes History of Present Illness: Kimberly Gaines is a 79 y.o.-year-old female who sustained a trimalleolar ankle fracture three weeks ago. Now s/p external fixator and NWB LLE.        Prior Functioning     Home Living Family/patient expects to be discharged to:: Skilled nursing facility         Vision/Perception     Cognition  Cognition Arousal/Alertness: Awake/alert Behavior During Therapy: [facility] for tasks assessed/performed Overall Cognitive Status: Within Functional Limits for tasks assessed    Extremity/Trunk Assessment Upper Extremity Assessment Upper Extremity Assessment: RUE deficits/detail;LUE deficits/detail RUE Deficits / Details: Ulnar deviation due to arthritis but [facility] for tasks assessed. LUE Deficits / Details: Ulnar deviation due to arthritis but [facility] for tasks assessed. Lower Extremity Assessment Lower Extremity Assessment: Generalized weakness;LLE deficits/detail LLE  Deficits / Details: sensation intact, grossly 2+/5 (difficulty lifting her leg because of heaviness of ex-fix) LLE: Unable to fully assess due to immobilization;Unable to fully assess due to pain     Mobility Bed Mobility Bed Mobility: Supine to Sit Supine  to Sit: 4: Min assist;3: Mod assist Details for Bed Mobility Assistance: minA to support LLE, pt initiating movement well however easily exhausted and needing cues for efficiency as well as assist for follow through; use of pad to help scoot hips edge of bed Transfers Details for Transfer Assistance: attempted sit->stand x3 however pt resistant and fearful needing totalA to maintain NWB and unable to clear buttocks from bed without heavy posterior lean; pt then participated in AP transfer needing minA to support LLE as well as step by step sequencing cues for efficiency and assist for follow through with use of pad to scoot hips     Exercise     Balance     End of Session OT - End of Session Equipment Utilized During Treatment: Gait belt Activity Tolerance: Patient tolerated treatment well Patient left: in chair;with call bell/phone within reach Nurse Communication: Mobility status;Weight bearing status;Need for lift equipment  GO Functional Assessment Tool Used: clinical judgement Functional Limitation: Self care Self Care Current Status (Y7829): At least 60 percent but less than 80 percent impaired, limited or restricted Self Care Goal Status (F6213): At least 40 percent but less than 60 percent impaired, limited or restricted   03/01/2013 Cipriano Mile OTR/L Pager [phone] Office [phone]  Cipriano Mile 03/01/2013, 5:28 PM

## 2013-03-02 MED ORDER — HYDROCODONE-ACETAMINOPHEN 5-325 MG PO TABS: 1.0000 | ORAL_TABLET | Freq: Four times a day (QID) | ORAL | Status: DC | PRN

## 2013-03-02 MED ORDER — POLYETHYLENE GLYCOL 3350 17 G PO PACK: 17.0000 g | PACK | Freq: Every day | ORAL | Status: AC

## 2013-03-02 MED ORDER — PROMETHAZINE HCL 25 MG PO TABS: 25.0000 mg | ORAL_TABLET | Freq: Four times a day (QID) | ORAL | Status: DC | PRN

## 2013-03-02 NOTE — Progress Notes (Signed)
Subjective:  Patient reports pain as mild.    Objective:   VITALS:   Filed Vitals:   03/01/13 0917 03/01/13 1536 03/01/13 2245 03/02/13 0600  BP: 137/75 138/71 147/93 136/82  Pulse: 95 86 72 76  Temp: 97.6 F (36.4 C) 98 F (36.7 C) 98.2 F (36.8 C) 98.3 F (36.8 C)  TempSrc: Oral Oral Oral Oral  Resp: 20 18 18 18   Height:      SpO2: 98% 96% 97% 98%    Neurologically intact Neurovascular intact Sensation intact distally Intact pulses distally Incision: dressing C/D/I and no drainage No cellulitis present Compartment soft   Lab Results  Component Value Date   WBC 12.3* 02/28/2013   HGB 11.1* 02/28/2013   HCT 35.2* 02/28/2013   MCV 94.9 02/28/2013   PLT 288 02/28/2013     Assessment/Plan: 2 Days Post-Op   Problem List Items Addressed This Visit   Closed fracture of unspecified part of upper end of humerus - Primary   Relevant Orders      Call MD / Call 911      Diet - low sodium heart healthy      Constipation Prevention      Increase activity slowly as tolerated      Advance diet Up with therapy Discharge to SNF DVT ppx Pain control Discharge planning   Cheral Almas 03/02/2013, 9:14 AM [phone]

## 2013-03-02 NOTE — Progress Notes (Signed)
Pt to return to [facility] today via EMS transport. Facility would like pt at 4:00pm. CSW arranged pick up for 3:30. Family is aware pt will be d/c today.  Cori Razor LCSW [phone]

## 2013-03-02 NOTE — Discharge Summary (Signed)
Physician Discharge Summary  Patient ID: Kimberly Gaines MRN: [id number] DOB/AGE: [birth year] 79 y.o.  Admit date: 02/28/2013 Discharge date: 03/02/2013  Admission Diagnoses:  Left ankle fracture, unstable  Discharge Diagnoses:  Same   Past Medical History  Diagnosis Date  . Iron deficiency anemia, unspecified   . Chronic diastolic heart failure 2012  . Unspecified constipation 2013  . 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 Edema 2012  . Reflux esophagitis 2013  . Unspecified essential hypertension 2012  . Mobitz (type) II atrioventricular block 2012  . Senile osteoporosis   . Personal history of malignant neoplasm of breast 1998    Left mastectomy  . Postmenopausal atrophic vaginitis 2014  . Senile cataract, unspecified 08/2012    Left extration/IOL implant  . Arthritis   . Osteoarthrosis, unspecified whether generalized or localized, lower leg 2008    s/p bilateral TKA  . Rheumatoid arthritis(714.0) 2013  . Pain in joint, lower leg 08/06/2012  . Dysuria 07/02/2012  . Urinary tract infection, site not specified 06/18/2012  . Reflux esophagitis 04/16/2012  . Pain in joint, forearm 12/26/2011  . Impacted cerumen 03/09/2011  . Anemia of other chronic disease 02/07/2011  . Urinary frequency 11/03/2010  . Pain in joint, site unspecified 07/20/2010  . Encounter for long-term (current) use of other medications 263.8    07/17/2010  . Other specified hypotension 07/17/2010  . Unspecified adverse effect of other drug, medicinal and biological substance(995.29) 07/17/2010  . Debility, unspecified 07/16/2010  . Dizziness and giddiness 07/17/2005  . Fracture, humerus 09/29/12    proximal left  . Unspecified hypothyroidism 2012  . Atrial fibrillation 02/01/2013    Surgeries: Procedure(s): EXTERNAL FIXATION LEFT ANKLE on 02/28/2013   Consultants (if any):    Discharged Condition: Improved  Hospital Course: Kimberly Gaines is an 79 y.o.  female who was admitted 02/28/2013 with a diagnosis of <principal problem not specified> and went to the operating room on 02/28/2013 and underwent the above named procedures.    She was given perioperative antibiotics:  Anti-infectives   Start     Dose/Rate Route Frequency Ordered Stop   03/01/13 0600  ceFAZolin (ANCEF) IVPB 2 g/50 mL premix     2 g 100 mL/hr over 30 Minutes Intravenous On call to O.R. 02/28/13 1602 02/28/13 1838   03/01/13 0030  ceFAZolin (ANCEF) IVPB 1 g/50 mL premix     1 g 100 mL/hr over 30 Minutes Intravenous Every 6 hours 02/28/13 2140 03/01/13 1504    .  She was given sequential compression devices, early ambulation, and aspirin for DVT prophylaxis.  She benefited maximally from the hospital stay and there were no complications.    Recent vital signs:  Filed Vitals:   03/02/13 0600  BP: 136/82  Pulse: 76  Temp: 98.3 F (36.8 C)  Resp: 18    Recent laboratory studies:  Lab Results  Component Value Date   HGB 11.1* 02/28/2013   HGB 12.4 09/28/2012   HGB 11.0* 07/16/2010   Lab Results  Component Value Date   WBC 12.3* 02/28/2013   PLT 288 02/28/2013   Lab Results  Component Value Date   INR 1.07 09/28/2012   Lab Results  Component Value Date   NA 142 03/01/2013   K 4.3 03/01/2013   CL 108 03/01/2013   CO2 23 03/01/2013   BUN 31* 03/01/2013   CREATININE 1.23* 03/01/2013   GLUCOSE 80 03/01/2013    Discharge  Medications:     Medication List         acetaminophen 325 MG tablet  Commonly known as:  TYLENOL  Take 650 mg by mouth 3 (three) times daily.     acetaminophen 500 MG tablet  Commonly known as:  TYLENOL  Take 500 mg by mouth every 6 (six) hours as needed for pain.     ascorbic acid 500 MG tablet  Commonly known as:  VITAMIN C  Take 1 tablet (500 mg total) by mouth 2 (two) times daily.     CALCIUM 600+D 600-400 MG-UNIT per tablet  Generic drug:  Calcium Carbonate-Vitamin D  Take 1 tablet by mouth 2 (two) times daily.     carvedilol 3.125 MG tablet   Commonly known as:  COREG  Take 3.125 mg by mouth 2 (two) times daily with a meal.     digoxin 0.125 MG tablet  Commonly known as:  LANOXIN  Take 0.125 mg by mouth daily.     docusate sodium 100 MG capsule  Commonly known as:  COLACE  Take 100 mg by mouth daily.     ELIQUIS 2.5 MG Tabs tablet  Generic drug:  apixaban  Take 2.5 mg by mouth 2 (two) times daily.     ferrous sulfate 325 (65 FE) MG tablet  Take 325 mg by mouth 2 (two) times daily.     HYDROcodone-acetaminophen 5-325 MG per tablet  Commonly known as:  NORCO  Take 1-2 tablets by mouth every 6 (six) hours as needed for pain.     levothyroxine 100 MCG tablet  Commonly known as:  SYNTHROID, LEVOTHROID  Take 100 mcg by mouth daily before breakfast.     lidocaine 2 % jelly  Commonly known as:  XYLOCAINE  Apply 1 application topically daily. For wound dressing change     losartan 50 MG tablet  Commonly known as:  COZAAR  Take 50 mg by mouth every morning.     meloxicam 15 MG tablet  Commonly known as:  MOBIC  Take 15 mg by mouth daily.     multivitamin with minerals Tabs tablet  Take 1 tablet by mouth daily.     oxyCODONE 5 MG immediate release tablet  Commonly known as:  Oxy IR/ROXICODONE  Take 1-2 tablets (5-10 mg total) by mouth every 3 (three) hours as needed.     pantoprazole 40 MG tablet  Commonly known as:  PROTONIX  Take 40 mg by mouth daily.     polyethylene glycol packet  Commonly known as:  MIRALAX / GLYCOLAX  Take 17 g by mouth daily.     polyethylene glycol packet  Commonly known as:  MIRALAX / GLYCOLAX  Take 17 g by mouth daily.     predniSONE 20 MG tablet  Commonly known as:  DELTASONE  Take 20 mg by mouth daily.     promethazine 25 MG tablet  Commonly known as:  PHENERGAN  Take 1 tablet (25 mg total) by mouth every 6 (six) hours as needed for nausea.     promethazine 25 MG tablet  Commonly known as:  PHENERGAN  Take 1 tablet (25 mg total) by mouth every 6 (six) hours as needed  for nausea.     spironolactone 25 MG tablet  Commonly known as:  ALDACTONE  Take 25 mg by mouth daily.        Diagnostic Studies: Dg Chest 2 View  02/28/2013   *RADIOLOGY REPORT*  Clinical Data: Pleural effusion, hypertension  CHEST -  2 VIEW  Comparison: February 06, 2013  Findings: There are bilateral pleural effusions, left greater than right with consolidation of left lung base.  These are stable compared prior exam.  There is central pulmonary vascular congestion unchanged.  The heart size is enlarged.  The mediastinal contour is stable.  Chronic deformity of the left proximal humerus is unchanged.  IMPRESSION: Mild congestive heart failure with cardiomegaly, stable.  Bilateral pleural effusions stable.   Original Report Authenticated By: Sherian Rein, M.D.   Dg Chest 2 View  02/06/2013   *RADIOLOGY REPORT*  Clinical Data: Shortness of breath, mild chest pain  CHEST - 2 VIEW  Comparison: Chest x-ray of 07/06/2010  Findings: There is cardiomegaly present with small pleural effusions and pulmonary vascular congestion consistent with mild congestive heart failure.  The bones are diffusely osteopenic.  IMPRESSION: Mild CHF with cardiomegaly, bilateral effusions, and pulmonary vascular congestion.   Original Report Authenticated By: Dwyane Dee, M.D.   Dg Ankle Complete Left  02/28/2013   *RADIOLOGY REPORT*  Clinical Data: External fixator placement.  DG C-ARM 1-60 MIN, LEFT ANKLE COMPLETE - 3+ VIEW  Technique: Nine fluoroscopic intraoperative spot views of the left lower leg are provided.  Comparison:  None.  Findings: Provided images demonstrate placement of a an external fixator with pins in the calcaneus and tibia.  Trimalleolar fracture is identified.  No acute abnormality is seen.  IMPRESSION: External fixator placement.  No acute finding.   Original Report Authenticated By: Holley Dexter, M.D.   Ct Ankle Left Wo Contrast  03/01/2013   CLINICAL DATA:  Trimalleolar fracture, status post external  fixator placement.  EXAM: CT OF THE LEFT ANKLE WITHOUT CONTRAST  TECHNIQUE: Multidetector CT imaging was performed according to the standard protocol. Multiplanar CT image reconstructions were also generated.  COMPARISON:  02/28/2013 intraoperative a images.  FINDINGS: Trimalleolar fracture includes an oblique moderately comminuted fracture of the distal fibula; a primarily transverse by considerably comminuted fracture of the medial malleolus; atypical oblique posterior malleolar fracture with moderate combination; and a separate fracture of the anterior lateral corner of the distal tibia extending to the articular surface is shown on image 48 of series 29562.  Tibiotalar distance reduced laterally. The dominant medial malleolar fragment maintains expected alignment with the talus, but this fragment and the talus are displaced 4 mm with respect to the dominant tibial shaft fragment.  Plantar calcaneal spur noted with thickening of the medial band of the plantar fascia. External fixator apparatus observed; the tibial screw is barely included at the top of the images. No complicating feature related to the transverse calcaneal rod component.  There is some subtle lucency in the inferolateral margin of the talar dome on image 45 of series 806-8 which could reflect an osteochondral lesion.  On images 52 and 53 of series 13086 there is concern for a nondisplaced fracture of the sustentaculum tali, but this is not readily confirmed on the other imaging planes.  A small accessory navicular is observed.  IMPRESSION: *Trimalleolar fracture with considerable comminution is noted above. There is also a fracture of the anterolateral corner of the distal tibia extending into the distal articular surface. *The main tibial shaft component is displaced 4 millimeters medially with respect to the talar dome and medial malleolar fragment. *Faint linear lucency in the sustentaculum tali have may well be artifactual, but a subtle  nondisplaced fracture is difficult to exclude.   Electronically Signed   By: Herbie Baltimore   On: 03/01/2013 13:20  Dg C-arm 1-60 Min  02/28/2013   *RADIOLOGY REPORT*  Clinical Data: External fixator placement.  DG C-ARM 1-60 MIN, LEFT ANKLE COMPLETE - 3+ VIEW  Technique: Nine fluoroscopic intraoperative spot views of the left lower leg are provided.  Comparison:  None.  Findings: Provided images demonstrate placement of a an external fixator with pins in the calcaneus and tibia.  Trimalleolar fracture is identified.  No acute abnormality is seen.  IMPRESSION: External fixator placement.  No acute finding.   Original Report Authenticated By: Holley Dexter, M.D.    Disposition: 62-Rehab Facility      Discharge Orders   Future Appointments Provider Department Dept Phone   03/06/2013 11:00 AM Toribio Harbour, NP [facility] [phone]   05/08/2013 3:30 PM Claudette Royston Sinner, NP [facility] [phone]   Future Orders Complete By Expires   Call MD / Call 911  As directed    Comments:     If you experience chest pain or shortness of breath, CALL 911 and be transported to the hospital emergency room.  If you develope a fever above 101 F, pus (white drainage) or increased drainage or redness at the wound, or calf pain, call your surgeon's office.   Constipation Prevention  As directed    Comments:     Drink plenty of fluids.  Prune juice may be helpful.  You may use a stool softener, such as Colace (over the counter) 100 mg twice a day.  Use MiraLax (over the counter) for constipation as needed.   Diet - low sodium heart healthy  As directed    Increase activity slowly as tolerated  As directed       Follow-up Information   Follow up with Cheral Almas, MD In 2 weeks.   Specialty:  Orthopedic Surgery   Contact information:   [address] Lajean Saver [place] [address] [phone]        Signed: Cheral Almas 03/02/2013, 11:33 AM

## 2013-03-02 NOTE — Progress Notes (Signed)
Report given to Sil Banker at [facility]) at 1230.  Awaiting transportation for discharge to facility.

## 2013-03-04 ENCOUNTER — Encounter: Payer: Self-pay | Admitting: Geriatric Medicine

## 2013-03-04 ENCOUNTER — Non-Acute Institutional Stay (SKILLED_NURSING_FACILITY): Payer: Medicare Other | Admitting: Geriatric Medicine

## 2013-03-04 DIAGNOSIS — S82892P Other fracture of left lower leg, subsequent encounter for closed fracture with malunion: Secondary | ICD-10-CM

## 2013-03-04 DIAGNOSIS — I4891 Unspecified atrial fibrillation: Secondary | ICD-10-CM

## 2013-03-04 DIAGNOSIS — L8992 Pressure ulcer of unspecified site, stage 2: Secondary | ICD-10-CM

## 2013-03-04 DIAGNOSIS — L8990 Pressure ulcer of unspecified site, unspecified stage: Secondary | ICD-10-CM

## 2013-03-04 DIAGNOSIS — IMO0002 Reserved for concepts with insufficient information to code with codable children: Secondary | ICD-10-CM

## 2013-03-04 NOTE — Progress Notes (Signed)
Patient ID: Kimberly Gaines, female   DOB: January 22, 1920, 79 y.o.   MRN: [id number] [facility] [phone])  Code Status: Living Will, [facility]  Contact Information   Name Relation Home Work [place] Son [phone]   Biggins,Veronica Daughter [phone]   Trinisha, Paget [phone]  [phone]      Chief Complaint  Patient presents with  . Hospitalization Follow-up    Left ankle fx    HPI: This 79 year old female resident of [facility], Assisted Living section was transferred to the rehabilitation section August 18 due to mobility issues related to her left ankle fracture. X-ray on August 18 revealed fracture of the lateral left malleolus and medial malleolus. She was evaluated by Dr. Prince Rome on 02/12/2013. He recommended a CAM boot for immobilization, minimal weightbearing for 2-3 weeks, with PT interventions for maintenance of stregth. At last visit, patient had been transferring with stand up lift,  had some discomfort in left ankle with any weight bearing. Patient returned to orthopedic office 02/28/13 for scheduled repeat X-ray.  X-ray revealed worsening of the ankle fracture, external fixation was recommended intervention to promote healing of these bones.  Patient or family agreed to hospital admission for this procedure.  Later that evening patient underwent an uneventful external fixation of her left ankle fractures. She tolerated the procedure well, postoperative course was uneventful.  The patient was discharged back to the Rehabilitation section at [facility] on Saturday, September 6.  Discharge instructions are not very clear but  the patient will be kept nonweightbearing on his left leg until she follows up with Dr. Roda Shutters in 2 weeks.  Pressure ulcer on her right bunion presented with cellulitis, this resolved after tx with Avelox. At last visit wound healing appeared  satlled, wound was ytresated with AGNO3 to promote granulation. This treatment was communicated to the hospital staff on admission. Wound has been kept covered since re-admission to [facility].   Allergies  Allergen Reactions  . Aleve [Naproxen Sodium] Other (See Comments)    Stomach bleeding.    Medications Reviewed  Data Reviewed  Radiology Exams   [facility] X-Ray 12/24/2012:   X-ray right hip: Chronic changes, osteoarthritic changes. 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 Minimal osteoarthritis. Acute fracture lateral malleolus of acute fracture knew the medial malleolus. No subluxation, dislocation or lytic destructive lesion.    Cardiovascular exams 02/01/2013 EKG: Irregular rhythm, heart rate 139, no P waves, LVH  Laboratory Studies   [facility] lab, external 07/03/2012 CBC: Rbc 3.91, Hgb 10.9, Hct 33.8   Retic count 1.0   CMP: BUN 29, creatinine 1.43, otherwise normal   Lipids: Total cholesterol 186, triglycerides 84, HDL 71, LDL 98   TSH 8.017   Rheumatoid factor 329 (normal less than 14)   10/30/2012: WBC 10.8, hemoglobin 9.7, hematocrit 29.5, platelets 222   Glucose or glucose 77, BUN 32, creatinine 1.38, sodium 142, potassium 5.0 LFTs WNL albumin 3.3   TSH 0.83 12/25/2012 WBC 10.5, hemoglobin 9.54, hematacrit 28.5, platelets 570  Glucose 109, BUN 38, creatinine 2.02, sodium 138, potassium 5.1. LFTs WNL. Albumin 2.7  TSH 0.380   rheumatoid factor 315  Fe 27 01/03/2013 WBC 14.1, hgb 9.6, hct 29.2, Plt 464  Glu 88, BUN 37, Cr 1.65, Na 137, K+ 4.7 LFTs WNL. Alb 3.0  Fe 66 01/29/2013 WBC 11.0, hemoglobin 10.8, hematocrit 32.3, platelets 212.  Glucose 84, BUN 39, creatinine 1.81, sodium 143,  potassium 4.4 01/31/2013: Iron 51  B12 371 8/26/ 2014 WBC 10.6, hemoglobin 9.8, hematocrit 29.3, platelets 278  Glucose 85, BUN 34, creatinine 1.64, sodium 140, potassium 5.0  TSH 2.19  Digoxin 1.0   Hospital Lab Lab Results  Component Value Date   WBC 12.3* 02/28/2013   HGB 11.1* 02/28/2013   HCT 35.2* 02/28/2013   PLT 288 02/28/2013        GLUCOSE 80 03/01/2013   ALT 25 03/01/2013   AST 24 03/01/2013   NA 142 03/01/2013   K 4.3 03/01/2013   CL 108 03/01/2013   CREATININE 1.23* 03/01/2013   BUN 31* 03/01/2013   CO2 23 03/01/2013       Other:  [facility] Ultrasound 12/24/2012 Bilateral lower extremity venous Doppler: Negative for DVT   Review of Systems  DATA OBTAINED: from patient, nurse, medical record GENERAL: Feels "OK"   No fevers, fatigue. Decreased appetite  SKIN: No itch, rash   EYES: No eye pain, dryness or itching  No change in vision EARS: No earache, tinnitus, change in hearing NOSE: No congestion, drainage or bleeding MOUTH/THROAT: No mouth or tooth pain    No sore throat      No difficulty chewing or swallowing RESPIRATORY: No cough, wheezing, SOB CARDIAC: No chest pain, palpitations  No edema. GI: No abdominal pain  No N/V/D or constipation  No heartburn or reflux  GU: No dysuria, frequency or urgency  No change in urine volume or character    MUSCULOSKELETAL: Minimal discomfort left ankle   No back pain  No muscle ache, pain, weakness    NEUROLOGIC: No dizziness, fainting, headache  No change in mental status.  PSYCHIATRIC: No feelings of anxiety, depression   Sleeps well.    Physical Exam Filed Vitals:   03/04/13 1320  BP: 143/71  Pulse: 76  Temp: 97.3 F (36.3 C)  Resp: 18  SpO2: 93%   GENERAL APPEARANCE: No acute distress, appropriately groomed, normal body habitus. Alert, pleasant, conversant SKIN: No diaphoresis, rash, unusual lesions.    Right first metatarsal head ulceration with no tenderness or erythema.  Ulcer < 1 cm in diameter with  undermining of  distal wound edge, wound base with granulation tissue evident after slough is removed.  Wound edges are smooth, pale, skin is stained from AgNO3.  Wound gently packed with small piece of Iodoform gauze, covered w/ dry gauze/ tape/  HEAD: Normocephalic, atraumatic EYES: Conjunctiva/lids clear.  EARS: External exam WNL, canals clear, TM WNL. Hearing grossly normal. NOSE: No deformity or discharge. MOUTH/THROAT: Lips w/o lesions. Oral mucosa, tongue moist, w/o lesion. Oropharynx w/o redness or lesions.  NECK: Supple, full ROM. No thyroid tenderness, enlargement or nodule LYMPHATICS: No head, neck or supraclavicular adenopathy RESPIRATORY: Breathing is even, unlabored. Lung sounds are clear and full.  CARDIOVASCULAR: Heart IRRR. No murmur or extra heart sounds  VENOUS: No varicosities. No venous stasis skin changes  EDEMA: Trace edema rt. Foot/ankle. GASTROINTESTINAL: Abdomen is soft, non-tender, not distended w/ normal bowel sounds.  MUSCULOSKELETAL: Moves UE and Rt LE extremities with full ROM, strength and tone. Left lower leg / foot with External fixator device in place. Pin sites are clean. Back is without kyphosis, scoliosis or spinal process tenderness.  NEUROLOGIC: Oriented to time, place, person. Sspeech clear, no  tremor.  PSYCHIATRIC: Mood and affect appropriate to situation  ASSESSMENT/PLAN  Ankle fracture, left Repeat x-ray of ankle fracture on September 4 showed worsening condition of fracture. External fixation was recommended and carried out. Patient is having minimal pain in his ankle, scheduled Tylenol dosing is satisfactory pain management at this time. We'll continue to have hydrocodone available p.r.n., patient does not need oxycodone. Continue nonweightbearing status, PT will be consulted for assistance with transferring and general maintenance of strength.  A return appointment to Dr. Roda Shutters will be arranged for 2 weeks.  Atrial fibrillation The patient remains with a regular  heart rhythm, rate is well controlled. Continue medications including anticoagulation.  Pressure ulcer stage II Ulcer right bunion area with slow healing. Wound remains clean with evidence of granulation tissue at base. Will continue daily dressing change, reassess in one week.  Patient goal:  Remains to return to [place] apartment  Follow up: 1 week or as needed  Shekelia Boutin T.Indya Oliveria, NP-C 03/04/2013

## 2013-03-04 NOTE — Assessment & Plan Note (Signed)
The patient remains with a regular heart rhythm, rate is well controlled. Continue medications including anticoagulation.

## 2013-03-04 NOTE — Assessment & Plan Note (Signed)
Repeat x-ray of ankle fracture on September 4 showed worsening condition of fracture. External fixation was recommended and carried out. Patient is having minimal pain in his ankle, scheduled Tylenol dosing is satisfactory pain management at this time. We'll continue to have hydrocodone available p.r.n., patient does not need oxycodone. Continue nonweightbearing status, PT will be consulted for assistance with transferring and general maintenance of strength.  A return appointment to Dr. Roda Shutters will be arranged for 2 weeks.

## 2013-03-04 NOTE — Assessment & Plan Note (Signed)
Ulcer right bunion area with slow healing. Wound remains clean with evidence of granulation tissue at base. Will continue daily dressing change, reassess in one week.

## 2013-03-04 NOTE — Progress Notes (Deleted)
Patient ID: Kimberly Gaines, female   DOB: October 17, 1919, 79 y.o.   MRN: [id number]

## 2013-03-06 ENCOUNTER — Encounter: Payer: Medicare Other | Admitting: Geriatric Medicine

## 2013-03-08 ENCOUNTER — Non-Acute Institutional Stay (SKILLED_NURSING_FACILITY): Payer: Medicare Other | Admitting: Geriatric Medicine

## 2013-03-08 ENCOUNTER — Encounter (HOSPITAL_COMMUNITY): Payer: Self-pay | Admitting: Orthopaedic Surgery

## 2013-03-08 DIAGNOSIS — L8992 Pressure ulcer of unspecified site, stage 2: Secondary | ICD-10-CM

## 2013-03-08 DIAGNOSIS — L8990 Pressure ulcer of unspecified site, unspecified stage: Secondary | ICD-10-CM

## 2013-03-08 NOTE — Assessment & Plan Note (Signed)
Wound is healing slowly; there is increased granulation tissue at wound base. Undermining of the wound edge is persistent but has not progressed. Continue current daily wound care

## 2013-03-08 NOTE — Progress Notes (Signed)
Patient ID: Kimberly Gaines, female   DOB: December 22, 1919, 79 y.o.   MRN: [id number] [facility] [phone])  Code Status: Living Will, [facility]  Contact Information   Name Relation Home Work Kimberly Gaines A Son [phone]   Kimberly Gaines,Kimberly Gaines Daughter [phone]   Kimberly Gaines, Kimberly Gaines [phone]  [phone]      Chief Complaint  Patient presents with  . Wound Check    HPI: This 79 year old female resident of [facility], Assisted Living section was transferred to the rehabilitation section August 18 due to mobility issues related to her left ankle fracture. X-ray on August 18 revealed fracture of the lateral left malleolus and medial malleolus. She was evaluated by Dr. Prince Rome on 02/12/2013. He recommended a CAM boot for immobilization, minimal weightbearing for 2-3 weeks, with PT interventions for maintenance of stregth. At last visit, patient had been transferring with stand up lift,  had some discomfort in left ankle with any weight bearing. Patient returned to orthopedic office 02/28/13 for scheduled repeat X-ray.  X-ray revealed worsening of the ankle fracture, external fixation was recommended intervention to promote healing of these bones.  Patient or family agreed to hospital admission for this procedure.  Later that evening patient underwent an uneventful external fixation of her left ankle fractures. She tolerated the procedure well, postoperative course was uneventful.  The patient was discharged back to the Rehabilitation section at [facility] on Saturday, September 6.  Discharge instructions are not very clear but  the patient will be kept nonweightbearing on his left leg until she follows up with Dr. Roda Shutters in 2 weeks.  Pressure ulcer on her right bunion presented with cellulitis, this resolved after tx with Avelox. Last week,  wound healing appeared to be stalled, wound was trested with  AGNO3 to promote granulation. Daily wound care has been a light packing with iodoform gauze and covered by dry gauze.  Allergies  Allergen Reactions  . Aleve [Naproxen Sodium] Other (See Comments)    Stomach bleeding.    Medications Reviewed  Data Reviewed  Radiology Exams   [facility] X-Ray 12/24/2012:   X-ray right hip: Chronic changes, osteoarthritic changes. 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 Minimal osteoarthritis. Acute fracture lateral malleolus of acute fracture knew the medial malleolus. No subluxation, dislocation or lytic destructive lesion.    Cardiovascular exams 02/01/2013 EKG: Irregular rhythm, heart rate 139, no P waves, LVH  Laboratory Studies   [facility] lab, external 07/03/2012 CBC: Rbc 3.91, Hgb 10.9, Hct 33.8   Retic count 1.0   CMP: BUN 29, creatinine 1.43, otherwise normal   Lipids: Total cholesterol 186, triglycerides 84, HDL 71, LDL 98   TSH 8.017   Rheumatoid factor 329 (normal less than 14)   10/30/2012: WBC 10.8, hemoglobin 9.7, hematocrit 29.5, platelets 222   Glucose or glucose 77, BUN 32, creatinine 1.38, sodium 142, potassium 5.0 LFTs WNL albumin 3.3   TSH 0.83 12/25/2012 WBC 10.5, hemoglobin 9.54, hematacrit 28.5, platelets 570  Glucose 109, BUN 38, creatinine 2.02, sodium 138, potassium 5.1. LFTs WNL. Albumin 2.7  TSH 0.380   rheumatoid factor 315  Fe 27 01/03/2013 WBC 14.1, hgb 9.6, hct 29.2, Plt 464  Glu 88, BUN 37, Cr 1.65, Na  137, K+ 4.7 LFTs WNL. Alb 3.0  Fe 66 01/29/2013 WBC 11.0, hemoglobin 10.8, hematocrit 32.3, platelets 212.  Glucose 84, BUN 39, creatinine 1.81, sodium 143, potassium 4.4 01/31/2013: Iron 51  B12 371 8/26/ 2014 WBC 10.6, hemoglobin  9.8, hematocrit 29.3, platelets 278  Glucose 85, BUN 34, creatinine 1.64, sodium 140, potassium 5.0  TSH 2.19  Digoxin 1.0   Hospital Lab Lab Results  Component Value Date   WBC 12.3* 02/28/2013   HGB 11.1* 02/28/2013   HCT 35.2* 02/28/2013   PLT 288 02/28/2013        GLUCOSE 80 03/01/2013   ALT 25 03/01/2013   AST 24 03/01/2013   NA 142 03/01/2013   K 4.3 03/01/2013   CL 108 03/01/2013   CREATININE 1.23* 03/01/2013   BUN 31* 03/01/2013   CO2 23 03/01/2013       Other:  [facility] Ultrasound 12/24/2012 Bilateral lower extremity venous Doppler: Negative for DVT   Review of Systems  DATA OBTAINED: from patient, nurse, medical record GENERAL: Feels "OK"   No fevers, fatigue. Decreased appetite  SKIN: No itch, rash   EYES: No eye pain, dryness or itching  No change in vision EARS: No earache, tinnitus, change in hearing NOSE: No congestion, drainage or bleeding MOUTH/THROAT: No mouth or tooth pain    No sore throat      No difficulty chewing or swallowing RESPIRATORY: No cough, wheezing, SOB CARDIAC: No chest pain, palpitations  No edema. GI: No abdominal pain  No N/V/D or constipation  No heartburn or reflux  GU: No dysuria, frequency or urgency  No change in urine volume or character    MUSCULOSKELETAL: Minimal discomfort left ankle   No back pain  No muscle ache, pain, weakness    NEUROLOGIC: No dizziness, fainting, headache  No change in mental status.  PSYCHIATRIC: No feelings of anxiety, depression   Sleeps well.    Physical Exam Filed Vitals:   03/08/13 1757  BP: 151/74  Pulse: 80  Temp: 97.6 F (36.4 C)  Resp: 20  SpO2: 95%   GENERAL APPEARANCE: No acute distress, appropriately groomed, normal body habitus. Alert, pleasant, conversant SKIN: No diaphoresis, rash, unusual lesions.    Right first metatarsal head ulceration with no tenderness or erythema.  Ulcer < 1 cm in diameter with small amount undermining of distal wound edge, wound base with increased granulation, no slough.   Wound edges are smooth, thin, not roled under, skin is stained from AgNO3.  Wound cleansed with NaCl, gauze, gently packed with small piece of Iodoform gauze, covered w/ dry gauze/ tape HEAD: Normocephalic, atraumatic EYES: Conjunctiva/lids clear.  RESPIRATORY: Breathing is even, unlabored. Lung sounds are clear and full.  CARDIOVASCULAR: Heart IRRR. No murmur or extra heart sounds  VENOUS: No varicosities. No venous stasis skin changes  EDEMA: Trace edema rt. Foot/ankle. GASTROINTESTINAL: Abdomen is soft, non-tender, not distended w/ normal bowel sounds.  MUSCULOSKELETAL: Moves UE and Rt LE extremities with full ROM, strength and tone. Left lower leg / foot with External fixator device in place. Pin sites are clean. Back is without kyphosis, scoliosis or spinal process tenderness.  NEUROLOGIC: Oriented to time, place, person. Sspeech clear, no tremor.  PSYCHIATRIC: Mood and affect appropriate to situation  ASSESSMENT/PLAN  Pressure ulcer stage II Wound is healing slowly; there is increased granulation tissue at wound base. Undermining of the wound edge is persistent but has not progressed. Continue current daily wound care   Patient goal:  Remains to return  to [place] apartment  Follow up: 1 week or as needed  Alexxander Kurt T.Quintasia Theroux, NP-C 03/08/2013

## 2013-04-11 ENCOUNTER — Other Ambulatory Visit (HOSPITAL_COMMUNITY): Payer: Self-pay | Admitting: Orthopaedic Surgery

## 2013-04-12 ENCOUNTER — Encounter: Payer: Self-pay | Admitting: Geriatric Medicine

## 2013-04-12 ENCOUNTER — Non-Acute Institutional Stay (SKILLED_NURSING_FACILITY): Payer: Medicare Other | Admitting: Geriatric Medicine

## 2013-04-12 DIAGNOSIS — S82892D Other fracture of left lower leg, subsequent encounter for closed fracture with routine healing: Secondary | ICD-10-CM

## 2013-04-12 DIAGNOSIS — D509 Iron deficiency anemia, unspecified: Secondary | ICD-10-CM

## 2013-04-12 DIAGNOSIS — I4891 Unspecified atrial fibrillation: Secondary | ICD-10-CM

## 2013-04-12 DIAGNOSIS — L8992 Pressure ulcer of unspecified site, stage 2: Secondary | ICD-10-CM

## 2013-04-12 DIAGNOSIS — L8990 Pressure ulcer of unspecified site, unspecified stage: Secondary | ICD-10-CM

## 2013-04-12 DIAGNOSIS — IMO0001 Reserved for inherently not codable concepts without codable children: Secondary | ICD-10-CM

## 2013-04-12 NOTE — Assessment & Plan Note (Signed)
Heart remains in the regular rhythm, rate is well controlled. Continue current medications including anticoagulation with Eliquis

## 2013-04-12 NOTE — Assessment & Plan Note (Signed)
Update lab 

## 2013-04-12 NOTE — Assessment & Plan Note (Addendum)
Orthopedic Re-evaluation yesterday, patient is ready for external fixator removal and casting. This is scheduled for October 21. Plan is for same-day surgery; patient will return to [facility] after procedure.

## 2013-04-12 NOTE — Progress Notes (Signed)
Patient ID: Kimberly Gaines, female   DOB: 19-Dec-1919, 79 y.o.   MRN: [id number] [facility] [phone])  Code Status: Living Will, [facility]  Contact Information   Name Relation Home Work [place] A Son [phone]   Biggins,Veronica Daughter [phone]   Jana, Swartzlander [phone]  [phone]      Chief Complaint  Patient presents with  . Medical Managment of Chronic Issues  . Ankle Injury, left  . Foot wound, rt    HPI: This 79 year old female resident of [facility], Assisted Living section was transferred to the rehabilitation section August 18 due to mobility issues related to her left ankle fracture. X-ray on August 18 revealed fracture of the lateral left malleolus and medial malleolus. She was evaluated by Dr. Prince Rome on 02/12/2013. He recommended a CAM boot for immobilization, minimal weightbearing for 2-3 weeks, with PT interventions for maintenance of stregth. At last visit, patient had been transferring with stand up lift,  had some discomfort in left ankle with any weight bearing. Patient returned to orthopedic office 02/28/13 for scheduled repeat X-ray.  X-ray revealed worsening of the ankle fracture, external fixation was recommended intervention to promote healing of these bones.    Later that evening patient underwent an uneventful external fixation of her left ankle fractures. She tolerated the procedure well, postoperative course was uneventful.  The patient was discharged back to the Rehabilitation section at [facility] on Saturday, September 6.  Discharge instructions are not very clear but  the patient will be kept nonweightbearing on his left leg until she follows up with Dr. Roda Shutters in 2 weeks.  Recent visits: Ankle fracture, left  Patient is having minimal pain in her ankle, scheduled Tylenol dosing is satisfactory pain management at this time. We'll continue to  have hydrocodone available p.r.n., patient does not need oxycodone. Continue nonweightbearing status, PT will be consulted for assistance with transferring and general maintenance of strength. A return appointment to Dr. Roda Shutters will be arranged for 2 weeks.  Atrial fibrillation  The patient remains with a regular heart rhythm, rate is well controlled. Continue medications including anticoagulation.  Pressure ulcer stage II  Wound is healing slowly; there is increased granulation tissue at wound base. Undermining of the wound edge is persistent but has not progressed. Continue current daily wound care  This patient return to orthopedics for reevaluation earlier this week. Dr. Roda Shutters has recommended pin removal and casting next week. He has requested stopping eloquence for one day prior to this procedure. Patient's right foot wound has continued slow healing. Daily wound dressings continued.  Review of record shows patient's vital signs and weight have remained stable, she is participating with therapy his, he is mobilizing in a manual wheelchair. Has also been learning to drive motorized wheelchair.   Allergies  Allergen Reactions  . Aleve [Naproxen Sodium] Other (See Comments)    Stomach bleeding.    Medications Reviewed  Data Reviewed  Radiology Exams   [facility] X-Ray 12/24/2012:   X-ray right hip: Chronic changes, osteoarthritic changes. 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 Minimal osteoarthritis. Acute fracture lateral malleolus of acute fracture knew the medial malleolus. No subluxation, dislocation or lytic destructive lesion.  Cardiovascular exams 02/01/2013 EKG: Irregular rhythm, heart rate 139, no P  waves, LVH  Laboratory Studies   [facility] lab, external 07/03/2012 CBC: Rbc 3.91, Hgb 10.9, Hct 33.8   Retic count 1.0   CMP: BUN 29, creatinine 1.43, otherwise normal   Lipids: Total cholesterol 186, triglycerides 84, HDL 71, LDL 98   TSH 8.017   Rheumatoid factor 329 (normal less than 14)   10/30/2012: WBC 10.8, hemoglobin 9.7, hematocrit 29.5, platelets 222   Glucose or glucose 77, BUN 32, creatinine 1.38, sodium 142, potassium 5.0 LFTs WNL albumin 3.3   TSH 0.83 12/25/2012 WBC 10.5, hemoglobin 9.54, hematacrit 28.5, platelets 570  Glucose 109, BUN 38, creatinine 2.02, sodium 138, potassium 5.1. LFTs WNL. Albumin 2.7  TSH 0.380   rheumatoid factor 315  Fe 27 01/03/2013 WBC 14.1, hgb 9.6, hct 29.2, Plt 464  Glu 88, BUN 37, Cr 1.65, Na 137, K+ 4.7 LFTs WNL. Alb 3.0  Fe 66 01/29/2013 WBC 11.0, hemoglobin 10.8, hematocrit 32.3, platelets 212.  Glucose 84, BUN 39, creatinine 1.81, sodium 143, potassium 4.4 01/31/2013: Iron 51  B12 371 8/26/ 2014 WBC 10.6, hemoglobin 9.8, hematocrit 29.3, platelets 278  Glucose 85, BUN 34, creatinine 1.64, sodium 140, potassium 5.0  TSH 2.19  Digoxin 1.0   Hospital Lab Lab Results  Component Value Date   WBC 12.3* 02/28/2013   HGB 11.1* 02/28/2013   HCT 35.2* 02/28/2013   PLT 288 02/28/2013        GLUCOSE 80 03/01/2013   ALT 25 03/01/2013   AST 24 03/01/2013   NA 142 03/01/2013   K 4.3 03/01/2013   CL 108 03/01/2013   CREATININE 1.23* 03/01/2013   BUN 31* 03/01/2013   CO2 23 03/01/2013       Other:  [facility] Ultrasound 12/24/2012 Bilateral lower extremity venous Doppler: Negative for DVT   Review of Systems  DATA OBTAINED: from patient, nurse, medical record GENERAL: Feels well.   No fevers, fatigue. Good appetite.  SKIN: No itch, rash   EYES: No eye pain, dryness or itching  No change in vision EARS: No earache, tinnitus, change in hearing NOSE: No congestion, drainage or bleeding MOUTH/THROAT: No mouth or tooth pain    No sore throat      No  difficulty chewing or swallowing RESPIRATORY: No cough, wheezing, SOB CARDIAC: No chest pain, palpitations  No edema. GI: No abdominal pain  No N/V/D or constipation  No heartburn or reflux  GU: No dysuria, frequency or urgency  No change in urine volume or character    MUSCULOSKELETAL: Minimal discomfort left ankle   No back pain  No muscle ache, pain, weakness    NEUROLOGIC: No dizziness, fainting, headache  No change in mental status.  PSYCHIATRIC: No feelings of anxiety, depression   Sleeps well.    Physical Exam Filed Vitals:   04/12/13 1648  BP: 140/76  Pulse: 85  Temp: 97.5 F (36.4 C)  Resp: 20  Weight: 155 lb (70.308 kg)  SpO2: 96%   GENERAL APPEARANCE: No acute distress, appropriately groomed, normal body habitus. Alert, pleasant, conversant SKIN: No diaphoresis, rash, unusual lesions.    Right first metatarsal head ulceration with no tenderness or erythema.  Ulcer < 1 cm, very superficial with minumal amount undermining of wound edges, wound base is clean with granulation, no slough.  Wound edges are smooth, thin, not rolled under.   EYES: Conjunctiva/lids clear.  RESPIRATORY: Breathing is even, unlabored. Lung sounds are clear and full.  CARDIOVASCULAR:  Heart IRRR. No murmur or extra heart sounds  VENOUS: No varicosities. No venous stasis skin changes  EDEMA: Trace edema rt. Foot/ankle. GASTROINTESTINAL: Abdomen is soft, non-tender, not distended w/ normal bowel sounds.  MUSCULOSKELETAL: Moves UE and Rt LE extremities with full ROM, strength and tone. Left lower leg / foot with External fixator device in place. Pin sites are clean. Back is without kyphosis, scoliosis or spinal process tenderness.  NEUROLOGIC: Oriented to time, place, person. Speech clear, no tremor.  PSYCHIATRIC: Mood and affect appropriate to situation  ASSESSMENT/PLAN  Atrial fibrillation Heart remains in the regular rhythm, rate is well controlled. Continue current medications including  anticoagulation with Eliquis  Pressure ulcer stage II Also continues to very slow healing. Wound is clean and granulating.  Ankle fracture, left Orthopedic Re-evaluation yesterday, patient is ready for external fixator removal and casting. This is scheduled for October 21. Plan is for same-day surgery; patient will return to [facility] after procedure.  Iron deficiency anemia, unspecified Update lab   Patient goal:  [place]  Follow up: As needed  Lab 10/21 CBC, BMP  Anida Deol T.Zacchaeus Halm, NP-C 04/12/2013

## 2013-04-12 NOTE — Assessment & Plan Note (Signed)
Also continues to very slow healing. Wound is clean and granulating.

## 2013-04-16 ENCOUNTER — Encounter (HOSPITAL_COMMUNITY): Payer: Self-pay | Admitting: *Deleted

## 2013-04-16 MED ORDER — CEFAZOLIN SODIUM-DEXTROSE 2-3 GM-% IV SOLR
2.0000 g | INTRAVENOUS | Status: AC
  Administered 2013-04-17: 2 g via INTRAVENOUS
  Filled 2013-04-16: qty 50

## 2013-04-16 NOTE — Progress Notes (Signed)
Spoke with pt nurse Darl Pikes to complete pt pre-op assessment.  According to Darl Pikes, NP d/c pt Eliquis starting today; pt to resume on 04/18/13. Nurse also advised to stop administering Aspirin and herbal medications. Do not take any NSAIDs ie: Ibuprofen, Advil, Naproxen or any medication containing Aspirin (meloxicam (MOBIC) 15 MG tablet).

## 2013-04-17 ENCOUNTER — Encounter (HOSPITAL_COMMUNITY)
Admission: RE | Disposition: A | Discharge status: Skilled Nursing Facility | Payer: Self-pay | Source: Ambulatory Visit | Attending: Orthopaedic Surgery

## 2013-04-17 ENCOUNTER — Encounter (HOSPITAL_COMMUNITY): Payer: Self-pay | Admitting: *Deleted

## 2013-04-17 ENCOUNTER — Ambulatory Visit (HOSPITAL_COMMUNITY)
Admission: RE | Admit: 2013-04-17 | Discharge: 2013-04-17 | Disposition: A | Discharge status: Skilled Nursing Facility | Payer: Medicare Other | Source: Ambulatory Visit | Attending: Orthopaedic Surgery | Admitting: Orthopaedic Surgery

## 2013-04-17 ENCOUNTER — Ambulatory Visit (HOSPITAL_COMMUNITY): Payer: Medicare Other | Admitting: Vascular Surgery

## 2013-04-17 ENCOUNTER — Encounter (HOSPITAL_COMMUNITY): Payer: Medicare Other | Admitting: Vascular Surgery

## 2013-04-17 DIAGNOSIS — E039 Hypothyroidism, unspecified: Secondary | ICD-10-CM | POA: Insufficient documentation

## 2013-04-17 DIAGNOSIS — Z4789 Encounter for other orthopedic aftercare: Secondary | ICD-10-CM | POA: Insufficient documentation

## 2013-04-17 DIAGNOSIS — M069 Rheumatoid arthritis, unspecified: Secondary | ICD-10-CM | POA: Insufficient documentation

## 2013-04-17 DIAGNOSIS — I5032 Chronic diastolic (congestive) heart failure: Secondary | ICD-10-CM | POA: Insufficient documentation

## 2013-04-17 DIAGNOSIS — Z79899 Other long term (current) drug therapy: Secondary | ICD-10-CM | POA: Insufficient documentation

## 2013-04-17 DIAGNOSIS — M810 Age-related osteoporosis without current pathological fracture: Secondary | ICD-10-CM | POA: Insufficient documentation

## 2013-04-17 DIAGNOSIS — M129 Arthropathy, unspecified: Secondary | ICD-10-CM | POA: Insufficient documentation

## 2013-04-17 DIAGNOSIS — IMO0002 Reserved for concepts with insufficient information to code with codable children: Secondary | ICD-10-CM | POA: Insufficient documentation

## 2013-04-17 DIAGNOSIS — I4891 Unspecified atrial fibrillation: Secondary | ICD-10-CM | POA: Insufficient documentation

## 2013-04-17 DIAGNOSIS — N952 Postmenopausal atrophic vaginitis: Secondary | ICD-10-CM | POA: Insufficient documentation

## 2013-04-17 DIAGNOSIS — I10 Essential (primary) hypertension: Secondary | ICD-10-CM | POA: Insufficient documentation

## 2013-04-17 DIAGNOSIS — S82892D Other fracture of left lower leg, subsequent encounter for closed fracture with routine healing: Secondary | ICD-10-CM

## 2013-04-17 DIAGNOSIS — D509 Iron deficiency anemia, unspecified: Secondary | ICD-10-CM | POA: Insufficient documentation

## 2013-04-17 DIAGNOSIS — M1990 Unspecified osteoarthritis, unspecified site: Secondary | ICD-10-CM | POA: Insufficient documentation

## 2013-04-17 DIAGNOSIS — Z96659 Presence of unspecified artificial knee joint: Secondary | ICD-10-CM | POA: Insufficient documentation

## 2013-04-17 HISTORY — PX: EXTERNAL FIXATION REMOVAL: SHX5040

## 2013-04-17 SURGERY — REMOVAL, EXTERNAL FIXATION DEVICE, LOWER EXTREMITY
Anesthesia: General | Site: Ankle | Laterality: Left | Wound class: Clean Contaminated

## 2013-04-17 MED ORDER — PROPOFOL 10 MG/ML IV BOLUS
INTRAVENOUS | Status: DC | PRN
  Administered 2013-04-17: 30 mg via INTRAVENOUS
  Administered 2013-04-17: 10 mg via INTRAVENOUS
  Administered 2013-04-17: 40 mg via INTRAVENOUS
  Administered 2013-04-17: 10 mg via INTRAVENOUS
  Administered 2013-04-17: 20 mg via INTRAVENOUS

## 2013-04-17 MED ORDER — MEPERIDINE HCL 25 MG/ML IJ SOLN: 6.2500 mg | INTRAMUSCULAR | Status: DC | PRN

## 2013-04-17 MED ORDER — FENTANYL CITRATE 0.05 MG/ML IJ SOLN
INTRAMUSCULAR | Status: DC | PRN
  Administered 2013-04-17 (×2): 50 ug via INTRAVENOUS

## 2013-04-17 MED ORDER — ONDANSETRON HCL 4 MG/2ML IJ SOLN: 4.0000 mg | Freq: Once | INTRAMUSCULAR | Status: DC | PRN

## 2013-04-17 MED ORDER — OXYCODONE HCL 5 MG/5ML PO SOLN: 5.0000 mg | Freq: Once | ORAL | Status: DC | PRN

## 2013-04-17 MED ORDER — HYDROMORPHONE HCL PF 1 MG/ML IJ SOLN: 0.2500 mg | INTRAMUSCULAR | Status: DC | PRN

## 2013-04-17 MED ORDER — OXYCODONE HCL 5 MG PO TABS: 5.0000 mg | ORAL_TABLET | Freq: Once | ORAL | Status: DC | PRN

## 2013-04-17 MED ORDER — SODIUM CHLORIDE 0.9 % IR SOLN
Status: DC | PRN
  Administered 2013-04-17: 1000 mL

## 2013-04-17 MED ORDER — LACTATED RINGERS IV SOLN
INTRAVENOUS | Status: DC
  Administered 2013-04-17: 09:00:00 via INTRAVENOUS

## 2013-04-17 SURGICAL SUPPLY — 56 items
BANDAGE CONFORM 3  STR LF (GAUZE/BANDAGES/DRESSINGS) IMPLANT
BANDAGE ELASTIC 3 VELCRO ST LF (GAUZE/BANDAGES/DRESSINGS) IMPLANT
BLADE SURG 10 STRL SS (BLADE) ×2 IMPLANT
BNDG COHESIVE 1X5 TAN STRL LF (GAUZE/BANDAGES/DRESSINGS) IMPLANT
BNDG COHESIVE 4X5 TAN STRL (GAUZE/BANDAGES/DRESSINGS) ×2 IMPLANT
BNDG COHESIVE 6X5 TAN STRL LF (GAUZE/BANDAGES/DRESSINGS) ×4 IMPLANT
BNDG GAUZE STRTCH 6 (GAUZE/BANDAGES/DRESSINGS) ×6 IMPLANT
CAST PADDING SYN 3 (CAST SUPPLIES) ×1 IMPLANT
CLOTH BEACON ORANGE TIMEOUT ST (SAFETY) ×2 IMPLANT
CORDS BIPOLAR (ELECTRODE) IMPLANT
COVER SURGICAL LIGHT HANDLE (MISCELLANEOUS) ×2 IMPLANT
CUFF TOURNIQUET SINGLE 18IN (TOURNIQUET CUFF) ×2 IMPLANT
CUFF TOURNIQUET SINGLE 24IN (TOURNIQUET CUFF) IMPLANT
CUFF TOURNIQUET SINGLE 34IN LL (TOURNIQUET CUFF) IMPLANT
CUFF TOURNIQUET SINGLE 44IN (TOURNIQUET CUFF) IMPLANT
DRAPE ORTHO SPLIT 77X108 STRL (DRAPES) ×4
DRAPE SURG 17X23 STRL (DRAPES) IMPLANT
DRAPE SURG ORHT 6 SPLT 77X108 (DRAPES) ×2 IMPLANT
DRAPE U-SHAPE 47X51 STRL (DRAPES) ×2 IMPLANT
DRSG ADAPTIC 3X8 NADH LF (GAUZE/BANDAGES/DRESSINGS) ×1 IMPLANT
DURAPREP 26ML APPLICATOR (WOUND CARE) ×1 IMPLANT
ELECT CAUTERY BLADE 6.4 (BLADE) IMPLANT
ELECT REM PT RETURN 9FT ADLT (ELECTROSURGICAL)
ELECTRODE REM PT RTRN 9FT ADLT (ELECTROSURGICAL) IMPLANT
GAUZE XEROFORM 1X8 LF (GAUZE/BANDAGES/DRESSINGS) ×2 IMPLANT
GLOVE BIOGEL PI IND STRL 6.5 (GLOVE) IMPLANT
GLOVE BIOGEL PI INDICATOR 6.5 (GLOVE) ×2
GLOVE SURG SS PI 7.5 STRL IVOR (GLOVE) ×4 IMPLANT
GOWN STRL NON-REIN LRG LVL3 (GOWN DISPOSABLE) ×2 IMPLANT
GOWN STRL REIN XL XLG (GOWN DISPOSABLE) ×2 IMPLANT
HANDPIECE INTERPULSE COAX TIP (DISPOSABLE)
KIT BASIN OR (CUSTOM PROCEDURE TRAY) ×2 IMPLANT
KIT ROOM TURNOVER OR (KITS) ×2 IMPLANT
MANIFOLD NEPTUNE II (INSTRUMENTS) ×2 IMPLANT
NS IRRIG 1000ML POUR BTL (IV SOLUTION) ×2 IMPLANT
PACK ORTHO EXTREMITY (CUSTOM PROCEDURE TRAY) ×2 IMPLANT
PAD ARMBOARD 7.5X6 YLW CONV (MISCELLANEOUS) ×4 IMPLANT
PADDING CAST ABS 4INX4YD NS (CAST SUPPLIES) ×2
PADDING CAST ABS COTTON 4X4 ST (CAST SUPPLIES) ×2 IMPLANT
PADDING CAST COTTON 6X4 STRL (CAST SUPPLIES) ×2 IMPLANT
SET HNDPC FAN SPRY TIP SCT (DISPOSABLE) IMPLANT
SPONGE GAUZE 4X4 12PLY (GAUZE/BANDAGES/DRESSINGS) ×2 IMPLANT
SPONGE LAP 18X18 X RAY DECT (DISPOSABLE) ×2 IMPLANT
STOCKINETTE IMPERVIOUS 9X36 MD (GAUZE/BANDAGES/DRESSINGS) ×1 IMPLANT
SUT ETHILON 2 0 FS 18 (SUTURE) ×6 IMPLANT
SUT ETHILON 3 0 PS 1 (SUTURE) ×4 IMPLANT
SYR BULB IRRIGATION 50ML (SYRINGE) ×1 IMPLANT
SYR CONTROL 10ML LL (SYRINGE) IMPLANT
TOWEL OR 17X24 6PK STRL BLUE (TOWEL DISPOSABLE) ×2 IMPLANT
TOWEL OR 17X26 10 PK STRL BLUE (TOWEL DISPOSABLE) ×2 IMPLANT
TUBE ANAEROBIC SPECIMEN COL (MISCELLANEOUS) IMPLANT
TUBE CONNECTING 12X1/4 (SUCTIONS) ×2 IMPLANT
TUBE FEEDING 5FR 15 INCH (TUBING) IMPLANT
UNDERPAD 30X30 INCONTINENT (UNDERPADS AND DIAPERS) ×2 IMPLANT
WATER STERILE IRR 1000ML POUR (IV SOLUTION) ×2 IMPLANT
YANKAUER SUCT BULB TIP NO VENT (SUCTIONS) ×2 IMPLANT

## 2013-04-17 NOTE — Brief Op Note (Signed)
   Brief Op Note  Date of Surgery: 04/17/2013  Preoperative Diagnosis: Left trimalleolar ankle fracture in external fixator  Postoperative Diagnosis: same  Procedure: Procedure(s): REMOVAL EXTERNAL FIXATION LEFT ANKLE AND CASTING Debridement of pin sites  Implants: none  Surgeons: Surgeon(s): Chimamanda Siegfried Glee Arvin, MD  Anesthesia: Choice  Drains: none  Estimated Blood Loss: * No blood loss amount entered *  Complications: None  Condition to PACU: Stable  Ryer Asato Glee Arvin, MD [facility] 04/17/2013 12:43 PM

## 2013-04-17 NOTE — Preoperative (Signed)
Beta Blockers   Reason not to administer Beta Blockers:Not Applicable 

## 2013-04-17 NOTE — Op Note (Signed)
Date of surgery: 04/17/2013  Preoperative diagnosis: Left trimalleolar ankle fracture treated in an external fixator  Postoperative diagnosis: Same next  Procedures: 1. Removal of external fixator left lower extremity. CPT 20694 2. Debridement of bone, soft tissue, and skin of left lower extremity. CPT 11044 3. Application of short-leg cast. CPT [phone]  Surgeon: N. Glee Arvin, MD  Anesthesia: Gen.  Estimated blood loss: minimal  Complications: None  Condition to PACU: Stable  Indications for procedure: Ms. Brinkley is a 79 year old female who is 6 weeks status post left ankle external fixator placement for an unstable trimalleolar ankle fracture. She presents today for external fixator removal and cast application.  Description of procedure: Ms. Flaim was identified in the preoperative holding area the operative extremity was confirmed with the patient and marked by the surgeon. She was brought back to the operating room.  Conscious sedation was administered by the anesthesiologist. The left lower sternal he was prepped and draped in standard sterile fashion the external fixator device was taken off. Once this was done the pin sites were sharply debrided with the use of a curette. Debridement of bone cutaneous tissue and skin was carried out. The pin sites were thoroughly irrigated with normal saline. Sterile dressings were applied to the pin sites. A short-leg cast was then applied with the ankle in neutral flexion. The patient tolerated the procedure well and awoke from sedation uneventfully. She was transferred to the PACU in stable condition.  Disposition: Ms. Fleites will return back to her facility. She will remain nonweightbearing to left lower extremity. She can restart her Eliquis for DVT prophylaxis.  She will return to see me in the office in 2 weeks. If x-rays look good at that time we may advance her weightbearing status.  Mayra Reel, MD [employer] [phone]

## 2013-04-17 NOTE — Transfer of Care (Signed)
Immediate Anesthesia Transfer of Care Note  Patient: Kimberly Gaines  Procedure(s) Performed: Procedure(s): REMOVAL EXTERNAL FIXATION LEFT ANKLE AND CASTING (Left)  Patient Location: PACU  Anesthesia Type:MAC  Level of Consciousness: awake, alert , oriented and patient cooperative  Airway & Oxygen Therapy: Patient Spontanous Breathing  Post-op Assessment: Report given to PACU RN, Post -op Vital signs reviewed and stable and Patient moving all extremities  Post vital signs: Reviewed and stable  Complications: No apparent anesthesia complications

## 2013-04-17 NOTE — Anesthesia Preprocedure Evaluation (Signed)
Anesthesia Evaluation  Patient identified by MRN, date of birth, ID band Patient awake    Reviewed: Allergy & Precautions, H&P , NPO status , Patient's Chart, lab work & pertinent test results  Airway Mallampati: I TM Distance: >3 FB Neck ROM: Full    Dental   Pulmonary          Cardiovascular hypertension, Pt. on medications + dysrhythmias Atrial Fibrillation     Neuro/Psych    GI/Hepatic   Endo/Other  Hypothyroidism   Renal/GU      Musculoskeletal  (+) Arthritis -, Rheumatoid disorders,    Abdominal   Peds  Hematology   Anesthesia Other Findings   Reproductive/Obstetrics                           Anesthesia Physical Anesthesia Plan  ASA: III  Anesthesia Plan: General   Post-op Pain Management:    Induction: Intravenous  Airway Management Planned: LMA  Additional Equipment:   Intra-op Plan:   Post-operative Plan: Extubation in OR  Informed Consent: I have reviewed the patients History and Physical, chart, labs and discussed the procedure including the risks, benefits and alternatives for the proposed anesthesia with the patient or authorized representative who has indicated his/her understanding and acceptance.     Plan Discussed with: CRNA and Surgeon  Anesthesia Plan Comments:         Anesthesia Quick Evaluation

## 2013-04-17 NOTE — Anesthesia Postprocedure Evaluation (Signed)
Anesthesia Post Note  Patient: Kimberly Gaines  Procedure(s) Performed: Procedure(s) (LRB): REMOVAL EXTERNAL FIXATION LEFT ANKLE AND CASTING (Left)  Anesthesia type: general  Patient location: PACU  Post pain: Pain level controlled  Post assessment: Patient's Cardiovascular Status Stable  Last Vitals:  Filed Vitals:   04/17/13 1200  BP:   Pulse:   Temp: 36 C  Resp:     Post vital signs: Reviewed and stable  Level of consciousness: sedated  Complications: No apparent anesthesia complications

## 2013-04-17 NOTE — H&P (Signed)
PREOPERATIVE H&P  Chief Complaint: left ankle fracture in ex fix  HPI: Kimberly Gaines is a 79 y.o. female who presents for ankle ex fix removal and cast application.  Past Medical History  Diagnosis Date  . Iron deficiency anemia, unspecified   . Chronic diastolic heart failure 2012  . Unspecified constipation 2013  . 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 Edema 2012  . Reflux esophagitis 2013  . Unspecified essential hypertension 2012  . Mobitz (type) II atrioventricular block 2012  . Senile osteoporosis   . Personal history of malignant neoplasm of breast 1998    Left mastectomy  . Postmenopausal atrophic vaginitis 2014  . Senile cataract, unspecified 08/2012    Left extration/IOL implant  . Arthritis   . Osteoarthrosis, unspecified whether generalized or localized, lower leg 2008    s/p bilateral TKA  . Rheumatoid arthritis(714.0) 2013  . Pain in joint, lower leg 08/06/2012  . Dysuria 07/02/2012  . Urinary tract infection, site not specified 06/18/2012  . Reflux esophagitis 04/16/2012  . Pain in joint, forearm 12/26/2011  . Impacted cerumen 03/09/2011  . Anemia of other chronic disease 02/07/2011  . Urinary frequency 11/03/2010  . Pain in joint, site unspecified 07/20/2010  . Encounter for long-term (current) use of other medications 263.8    07/17/2010  . Other specified hypotension 07/17/2010  . Unspecified adverse effect of other drug, medicinal and biological substance(995.29) 07/17/2010  . Debility, unspecified 07/16/2010  . Dizziness and giddiness 07/17/2005  . Fracture, humerus 09/29/12    proximal left  . Unspecified hypothyroidism 2012  . Atrial fibrillation 02/01/2013   Past Surgical History  Procedure Laterality Date  . Appendectomy  1978  . Abdominal hysterectomy  1978  . Mastectomy Left 1998  . Cataract extraction w/ intraocular lens  implant, bilateral  [id number]    Rt 2003, left 2014  . Joint replacement  Bilateral     Knees  . Breast surgery Left 1998    mastectomy  . Eye surgery Right 2003    cataract extraction/IOL implant  . External fixation leg Left 02/28/2013    Procedure: EXTERNAL FIXATION LEFT ANKLE;  Surgeon: Cheral Almas, MD;  Location: [facility] OR;  Service: Orthopedics;  Laterality: Left;   History   Social History  . Marital Status: Widowed    Spouse Name: N/A    Number of Children: N/A  . Years of Education: N/A   Social History Main Topics  . Smoking status: Never Smoker   . Smokeless tobacco: Never Used  . Alcohol Use: Yes     Comment: occ  . Drug Use: Yes    Special: Other-see comments  . Sexual Activity: None   Other Topics Concern  . None   Social History Narrative   Widow. Resides in Independent Living apartment at [name] since 2005. 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 No smoking history, minimal alcohol, intake. Stopped driving several years ago.    Family History  Problem Relation Age of Onset  . Pneumonia Brother   . Drug abuse Son    Allergies  Allergen Reactions  . Aleve [Naproxen Sodium] Other (See Comments)    Stomach bleeding.    Prior to Admission medications   Medication Sig Start Date End Date Taking? Authorizing Provider  acetaminophen (TYLENOL) 325 MG tablet Take 650 mg by mouth 3 (three) times daily.   Yes Historical Provider, MD  apixaban (ELIQUIS) 2.5 MG TABS  tablet Take 2.5 mg by mouth 2 (two) times daily.   Yes Historical Provider, MD  Calcium Carbonate-Vitamin D (CALCIUM 600+D) 600-400 MG-UNIT per tablet Take 1 tablet by mouth 2 (two) times daily.   Yes Historical Provider, MD  carvedilol (COREG) 3.125 MG tablet Take 3.125 mg by mouth 2 (two) times daily with a meal.   Yes Historical Provider, MD  digoxin (LANOXIN) 0.125 MG tablet Take 0.125 mg by mouth daily. 02/02/13  Yes Historical Provider, MD  docusate sodium (COLACE) 100 MG capsule Take 100 mg by mouth daily.   Yes Historical Provider, MD   ferrous sulfate 325 (65 FE) MG tablet Take 325 mg by mouth 2 (two) times daily.   Yes Historical Provider, MD  HYDROcodone-acetaminophen (NORCO/VICODIN) 5-325 MG per tablet Take 1 tablet by mouth every 6 (six) hours as needed for pain. 03/02/13  Yes Claudette Royston Sinner, NP  levothyroxine (SYNTHROID, LEVOTHROID) 100 MCG tablet Take 100 mcg by mouth daily before breakfast.   Yes Historical Provider, MD  losartan (COZAAR) 50 MG tablet Take 50 mg by mouth every morning.   Yes Historical Provider, MD  meloxicam (MOBIC) 15 MG tablet Take 15 mg by mouth daily.   Yes Historical Provider, MD  Multiple Vitamin (MULTIVITAMIN WITH MINERALS) TABS tablet Take 1 tablet by mouth daily.   Yes Historical Provider, MD  pantoprazole (PROTONIX) 40 MG tablet Take 40 mg by mouth daily.   Yes Historical Provider, MD  polyethylene glycol (MIRALAX / GLYCOLAX) packet Take 17 g by mouth daily. 03/02/13  Yes Naiping Glee Arvin, MD  predniSONE (DELTASONE) 20 MG tablet Take 20 mg by mouth daily.   Yes Historical Provider, MD  promethazine (PHENERGAN) 25 MG tablet Take 1 tablet (25 mg total) by mouth every 6 (six) hours as needed for nausea. 03/01/13  Yes Naiping Glee Arvin, MD  spironolactone (ALDACTONE) 25 MG tablet Take 25 mg by mouth daily.   Yes Historical Provider, MD     Positive ROS: All other systems have been reviewed and were otherwise negative with the exception of those mentioned in the HPI and as above.  Physical Exam: General: Alert, no acute distress Cardiovascular: No pedal edema Respiratory: No cyanosis, no use of accessory musculature GI: No organomegaly, abdomen is soft and non-tender Skin: No lesions in the area of chief complaint Neurologic: Sensation intact distally Psychiatric: Patient is competent for consent with normal mood and affect Lymphatic: No axillary or cervical lymphadenopathy  MUSCULOSKELETAL:  Ex fix in place Pin site c/d/i Foot wwp  Assessment: left ankle fracture in ex  fix  Plan: Plan for Procedure(s): REMOVAL EXTERNAL FIXATION LEFT ANKLE AND CASTING  The risks benefits and alternatives were discussed with the patient including but not limited to the risks of nonoperative treatment, versus surgical intervention including infection, bleeding, nerve injury,  blood clots, cardiopulmonary complications, morbidity, mortality, among others, and they were willing to proceed.   Cheral Almas, MD   04/17/2013 9:49 AM

## 2013-04-17 NOTE — Anesthesia Procedure Notes (Signed)
Date/Time: 04/17/2013 10:33 AM Performed by: Jerilee Hoh Pre-anesthesia Checklist: Patient identified, Emergency Drugs available, Suction available and Patient being monitored Patient Re-evaluated:Patient Re-evaluated prior to inductionOxygen Delivery Method: Simple face mask Placement Confirmation: positive ETCO2 and breath sounds checked- equal and bilateral

## 2013-04-18 ENCOUNTER — Encounter (HOSPITAL_COMMUNITY): Payer: Self-pay | Admitting: Orthopaedic Surgery

## 2013-04-19 ENCOUNTER — Encounter: Payer: Self-pay | Admitting: Geriatric Medicine

## 2013-04-19 ENCOUNTER — Non-Acute Institutional Stay (SKILLED_NURSING_FACILITY): Payer: Medicare Other | Admitting: Geriatric Medicine

## 2013-04-19 DIAGNOSIS — M069 Rheumatoid arthritis, unspecified: Secondary | ICD-10-CM

## 2013-04-19 DIAGNOSIS — IMO0002 Reserved for concepts with insufficient information to code with codable children: Secondary | ICD-10-CM

## 2013-04-19 DIAGNOSIS — L8992 Pressure ulcer of unspecified site, stage 2: Secondary | ICD-10-CM

## 2013-04-19 DIAGNOSIS — I4891 Unspecified atrial fibrillation: Secondary | ICD-10-CM

## 2013-04-19 DIAGNOSIS — D509 Iron deficiency anemia, unspecified: Secondary | ICD-10-CM

## 2013-04-19 DIAGNOSIS — L8990 Pressure ulcer of unspecified site, unspecified stage: Secondary | ICD-10-CM

## 2013-04-19 DIAGNOSIS — S82892K Other fracture of left lower leg, subsequent encounter for closed fracture with nonunion: Secondary | ICD-10-CM

## 2013-04-19 NOTE — Assessment & Plan Note (Signed)
GFR has ranged  27-31 the last 2 months.  Remains stage III, monitor at intervals.

## 2013-04-19 NOTE — Assessment & Plan Note (Signed)
Joint pain is well managed, right hand with significant deformity, a pursestring the function at baseline status. Continue current medications

## 2013-04-19 NOTE — Progress Notes (Signed)
Patient ID: Kimberly Gaines, female   DOB: 26-Mar-1920, 79 y.o.   MRN: [id number] [facility] [phone])  Code Status: Living Will, [facility]  Contact Information   Name Relation Home Work [place] A Son [phone]   Biggins,Veronica Daughter [phone]   [name], [name] [phone]  [phone]      Chief Complaint  Patient presents with  . Medical Managment of Chronic Issues    ankle fracture, anemia, af, anticaogulation    HPI: This 79 year old female resident of [facility], Assisted Living section is residing in rehabilitation section since August 18 due to mobility issues related to her left ankle fracture. She was evaluated by Dr. Prince Rome on 02/12/2013. He recommended a CAM boot for immobilization, minimal weightbearing for 2-3 weeks, with PT interventions for maintenance of stregth. Patient returned to orthopedic office 02/28/13 for scheduled repeat X-ray. This revealed worsening of the ankle fracture; external fixation was recommended intervention to promote healing of these bones.    Later that evening patient underwent an uneventful external fixation of her left ankle fractures. She tolerated the procedure well, postoperative course was uneventful.  The patient was discharged back to the Rehabilitation section at [facility] on Saturday, September 6.    Last visit:   Atrial fibrillation Heart remains in the regular rhythm, rate is well controlled. Continue current medications including anticoagulation with Eliquis  Pressure ulcer stage II Also continues to very slow healing. Wound is clean and granulating.  Ankle fracture, left Orthopedic Re-evaluation yesterday, patient is ready for external fixator removal and casting. This is scheduled for October 21. Plan is for same-day surgery; patient will return to [facility] after procedure.  Iron deficiency anemia,  unspecified Update lab  Today: Patient reports feeling very well after orthopedic procedure the other day. External fixator was removed from her left ankle and replaced with a plaster cast. She returned to [facility] rehabilitation section after the procedure. Pain in her ankle has been minimal, she did require one dose of hydrocodone evening October 22. Instructions from Dr. Roda Shutters are to remain nonweightbearing on the left leg, with return to his office on November 6. OT continues to work with patient and improving ADLs ,transfer status and mobility with motorized wheelchair. Recent lab reveals mild decrease in hemoglobin and hematocrit since last measured, renal function remained stable. Joint pain related to rheumatoid arthritis is "not too bad".    Allergies  Allergen Reactions  . Aleve [Naproxen Sodium] Other (See Comments)    Stomach bleeding.    Medications Reviewed  Data Reviewed  Radiology Exams   [facility] X-Ray 12/24/2012:   X-ray right hip: Chronic changes, osteoarthritic changes. 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 Minimal osteoarthritis. Acute fracture lateral malleolus of acute fracture knew the medial malleolus. No subluxation, dislocation or lytic destructive lesion.    Cardiovascular exams 02/01/2013 EKG: Irregular rhythm, heart rate 139, no P waves, LVH  Laboratory Studies   [facility] lab, external 07/03/2012 CBC: Rbc 3.91, Hgb 10.9, Hct 33.8   Retic count 1.0   CMP: BUN 29, creatinine 1.43, otherwise normal   Lipids: Total cholesterol 186, triglycerides 84, HDL 71, LDL 98   TSH 8.017   Rheumatoid factor 329 (normal less  than 14)   10/30/2012: WBC 10.8, hemoglobin 9.7, hematocrit  29.5, platelets 222   Glucose or glucose 77, BUN 32, creatinine 1.38, sodium 142, potassium 5.0 LFTs WNL albumin 3.3   TSH 0.83 12/25/2012 WBC 10.5, hemoglobin 9.54, hematacrit 28.5, platelets 570  Glucose 109, BUN 38, creatinine 2.02, sodium 138, potassium 5.1. LFTs WNL. Albumin 2.7  TSH 0.380   rheumatoid factor 315  Fe 27 01/03/2013 WBC 14.1, hgb 9.6, hct 29.2, Plt 464  Glu 88, BUN 37, Cr 1.65, Na 137, K+ 4.7 LFTs WNL. Alb 3.0  Fe 66 01/29/2013 WBC 11.0, hemoglobin 10.8, hematocrit 32.3, platelets 212.  Glucose 84, BUN 39, creatinine 1.81, sodium 143, potassium 4.4 01/31/2013: Iron 51  B12 371 8/26/ 2014 WBC 10.6, hemoglobin 9.8, hematocrit 29.3, platelets 278  Glucose 85, BUN 34, creatinine 1.64, sodium 140, potassium 5.0  TSH 2.19  Digoxin 1.0  04/16/2013 WBC 10.3, hemoglobin 10.9, hematocrit 32.7, platelet 242  Glucose 77, BUN 31, creatinine 1.62, sodium 143, potassium 4.4     Other:    [facility] Ultrasound 12/24/2012 Bilateral lower extremity venous Doppler: Negative for DVT   Review of Systems  DATA OBTAINED: from patient, nurse, medical record GENERAL: Feels well.   No fevers, fatigue. Good appetite.  SKIN: No itch, rash   EYES: No eye pain, dryness or itching  No change in vision EARS: No earache, tinnitus, change in hearing NOSE: No congestion, drainage or bleeding MOUTH/THROAT: No mouth or tooth pain    No sore throat      No difficulty chewing or swallowing RESPIRATORY: No cough, wheezing, SOB CARDIAC: No chest pain, palpitations  No edema. GI: No abdominal pain  No N/V/D or constipation  No heartburn or reflux  GU: No dysuria, frequency or urgency  No change in urine volume or character    MUSCULOSKELETAL: Minimal discomfort left ankle   No back pain  No muscle ache, pain, weakness    NEUROLOGIC: No dizziness, fainting, headache  No change in mental status.  PSYCHIATRIC: No feelings of anxiety, depression   Sleeps well.    Physical Exam Filed Vitals:    04/19/13 1118  BP: 155/76  Pulse: 73  SpO2: 92%   GENERAL APPEARANCE: No acute distress, appropriately groomed, normal body habitus. Alert, pleasant, conversant SKIN: No diaphoresis, rash, unusual lesions.    Right first metatarsal head ulceration with no tenderness or erythema.  Ulcer < 1 cm, very superficial with minumal amount undermining of wound edges, wound base is clean with granulation, no slough.  Wound edges are smooth, thin, not rolled under.   EYES: Conjunctiva/lids clear.  RESPIRATORY: Breathing is even, unlabored. Lung sounds are clear and full.  CARDIOVASCULAR: Heart IRRR. No murmur or extra heart sounds  VENOUS: No varicosities. No venous stasis skin changes  EDEMA: Trace edema rt. Foot/ankle. GASTROINTESTINAL: Abdomen is soft, non-tender, not distended w/ normal bowel sounds.  MUSCULOSKELETAL: Moves UE and Rt LE extremities with full ROM, strength and tone. Bilateral hands with arthritic deformity, right worse than left Left lower leg / foot with Plaster cast place, toes are mobile, warm. Marland Kitchen  NEUROLOGIC: Oriented to time, place, person. Speech clear, no tremor.  PSYCHIATRIC: Mood and affect appropriate to situation  ASSESSMENT/PLAN  Atrial fibrillation Remains irregular rhythm, rate well controlled. Continue current medications including anticoagulation  Rheumatoid arthritis(714.0) Joint pain is well managed, right hand with significant deformity, a pursestring the function at baseline status. Continue current medications  Pressure ulcer stage II Minimal change, wound base remains clean  with granulation tissue wound edges are quite thin minimal contraction  Ankle fracture, left Patient is now status post removal of external external fixator with placement of plaster cast. No complaints of pain, toes left foot are warm and mobile. Remains nonweightbearing status, returns to Dr. Roda Shutters in approximately 2 weeks for repeat x-ray and reassessment of healing.  Iron deficiency  anemia, unspecified Most recent labs stable, anemia likely related to renal disease. Continue  iron supplement  Chronic kidney disease (CKD), stage III (moderate) GFR has ranged  27-31 the last 2 months.  Remains stage III, monitor at intervals.   Patient goal:  Remains to return to [place] apartment  Follow up: As needed   Jayleon Mcfarlane T.Peighton Mehra, NP-C 04/19/2013

## 2013-04-19 NOTE — Assessment & Plan Note (Signed)
Minimal change, wound base remains clean with granulation tissue wound edges are quite thin minimal contraction

## 2013-04-19 NOTE — Assessment & Plan Note (Signed)
Patient is now status post removal of external external fixator with placement of plaster cast. No complaints of pain, toes left foot are warm and mobile. Remains nonweightbearing status, returns to Dr. Roda Shutters in approximately 2 weeks for repeat x-ray and reassessment of healing.

## 2013-04-19 NOTE — Assessment & Plan Note (Signed)
Most recent labs stable, anemia likely related to renal disease. Continue  iron supplement

## 2013-04-19 NOTE — Assessment & Plan Note (Signed)
Remains irregular rhythm, rate well controlled. Continue current medications including anticoagulation

## 2013-05-08 ENCOUNTER — Encounter: Payer: Medicare Other | Admitting: Geriatric Medicine

## 2013-06-14 ENCOUNTER — Encounter: Payer: Self-pay | Admitting: Geriatric Medicine

## 2013-06-14 ENCOUNTER — Non-Acute Institutional Stay (SKILLED_NURSING_FACILITY): Payer: Medicare Other | Admitting: Geriatric Medicine

## 2013-06-14 DIAGNOSIS — K625 Hemorrhage of anus and rectum: Secondary | ICD-10-CM

## 2013-06-14 LAB — CBC AND DIFFERENTIAL
Hemoglobin: 10 g/dL — AB (ref 12.0–16.0)
Platelets: 240 10*3/uL (ref 150–399)
WBC: 11.6 10^3/mL

## 2013-06-14 LAB — BASIC METABOLIC PANEL
Creatinine: 1.7 mg/dL — AB (ref 0.5–1.1)
Potassium: 3.8 mmol/L (ref 3.4–5.3)

## 2013-06-14 NOTE — Progress Notes (Signed)
Patient ID: Kimberly Gaines, female   DOB: Sep 25, 1919, 79 y.o.   MRN: [id number]  [facility] [phone])  Code Status: Living Will, [facility]  Contact Information   Name Relation Home Work [place] A Son [phone]   Biggins,Veronica Daughter [phone]   Tamula, Morrical [phone]  [phone]      Chief Complaint  Patient presents with  . Melena  . Medical Managment of Chronic Issues    HPI: This 79 year old female resident of [facility], Assisted Living section is residing in rehabilitation section since August 18 due to mobility issues related to her left ankle fracture.  Evaluation is requested today to 2 rectal bleeding last night. Patient reports she was "straining" with bowel movement. She denies any abdominal pain or cramping. This been no further rectal bleeding today.  Medications Reviewed  Data Reviewed  Radiology Exams   [facility] X-Ray 12/24/2012:   X-ray right hip: Chronic changes, osteoarthritic changes. 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 Minimal osteoarthritis. Acute fracture lateral malleolus of acute fracture knew the medial malleolus. No subluxation, dislocation or lytic destructive lesion.    Cardiovascular exams 02/01/2013 EKG: Irregular rhythm, heart rate 139, no P waves, LVH  Laboratory Studies   [facility] lab, external 07/03/2012 CBC: Rbc 3.91, Hgb 10.9, Hct 33.8   Retic count 1.0   CMP: BUN 29, creatinine 1.43, otherwise normal   Lipids: Total cholesterol 186, triglycerides 84, HDL 71, LDL 98   TSH 8.017   Rheumatoid factor 329 (normal less than  14)   10/30/2012: WBC 10.8, hemoglobin 9.7, hematocrit 29.5, platelets 222   Glucose or glucose 77, BUN 32, creatinine 1.38, sodium 142, potassium 5.0 LFTs WNL albumin 3.3   TSH 0.83 12/25/2012 WBC 10.5, hemoglobin 9.54, hematacrit 28.5, platelets 570  Glucose 109, BUN 38, creatinine 2.02, sodium 138, potassium 5.1. LFTs WNL. Albumin 2.7  TSH 0.380   rheumatoid factor 315  Fe 27 01/03/2013 WBC 14.1, hgb 9.6, hct 29.2, Plt 464  Glu 88, BUN 37, Cr 1.65, Na 137, K+ 4.7 LFTs WNL. Alb 3.0  Fe 66 01/29/2013 WBC 11.0, hemoglobin 10.8, hematocrit 32.3, platelets 212.  Glucose 84, BUN 39, creatinine 1.81, sodium 143, potassium 4.4 01/31/2013: Iron 51  B12 371 8/26/ 2014 WBC 10.6, hemoglobin 9.8, hematocrit 29.3, platelets 278  Glucose 85, BUN 34, creatinine 1.64, sodium 140, potassium 5.0  TSH 2.19  Digoxin 1.0  04/16/2013 WBC 10.3, hemoglobin 10.9, hematocrit 32.7, platelet 242  Glucose 77, BUN 31, creatinine 1.62, sodium 143, potassium 4.4     Other:    [facility] Ultrasound 12/24/2012 Bilateral lower extremity venous Doppler: Negative for DVT   Review of Systems  DATA OBTAINED: from patient, nurse, medical record GENERAL: Feels well.   No fevers, fatigue. Good appetite.  SKIN: No itch, rash   EYES: No eye pain, dryness or itching  No change in vision EARS: No earache, tinnitus, change in hearing NOSE: No congestion, drainage or bleeding MOUTH/THROAT: No mouth or tooth pain    No sore throat      No difficulty chewing or swallowing RESPIRATORY: No cough, wheezing, SOB CARDIAC: No chest pain, palpitations  No edema. GI: No abdominal pain  No  N/V/D or constipation  No heartburn or reflux  GU: No dysuria, frequency or urgency  No change in urine volume or character    MUSCULOSKELETAL: No discomfort left ankle   No back pain  No muscle ache, pain, weakness   NEUROLOGIC: No dizziness, fainting, headache  No change in mental status.  PSYCHIATRIC: No feelings of anxiety, depression    Sleeps well.    Physical Exam Filed Vitals:   06/14/13 1228  BP: 139/75  Pulse: 80  Temp: 99.1 F (37.3 C)  Resp: 20  SpO2: 95%   GENERAL APPEARANCE: No acute distress, appropriately groomed, normal body habitus. Alert, pleasant, conversant SKIN: No diaphoresis, rash, unusual lesions.     EYES: Conjunctiva/lids clear.  RESPIRATORY: Breathing is even, unlabored. Lung sounds are clear and full.  CARDIOVASCULAR: Heart IRRR. No murmur or extra heart sounds  VENOUS: No varicosities. No venous stasis skin changes  EDEMA: Trace edema rt. Foot/ankle. GASTROINTESTINAL: Abdomen is soft, non-tender, not distended w/ normal bowel sounds.   RECTAL: No external hemorrhoids apparent, digital exam does not reveal any significant internal hemorrhoids. No blood on exam finger. MUSCULOSKELETAL: Moves UE and Rt LE extremities with full ROM, strength and tone. Bilateral hands with arthritic deformity, right worse than left Left lower leg / foot with CAM boot. NEUROLOGIC: Oriented to time, place, person. Speech clear, no tremor.  PSYCHIATRIC: Mood and affect appropriate to situation  ASSESSMENT/PLAN  Rectal bleeding Single episode of rectal bleeding last evening. No external hemorrhoids, no enlarged internal hemorrhoids apparent no irritation of the rectal anal mucosa is possible with straining with bowel movement. Will guaiac stools x2. Check CBC today   Follow up: As needed  Lab today CBC,CMP   Lasundra Hascall T.Kasch Borquez, NP-C 06/14/2013

## 2013-06-18 DIAGNOSIS — K625 Hemorrhage of anus and rectum: Secondary | ICD-10-CM | POA: Insufficient documentation

## 2013-06-18 NOTE — Assessment & Plan Note (Signed)
Single episode of rectal bleeding last evening. No external hemorrhoids, no enlarged internal hemorrhoids apparent no irritation of the rectal anal mucosa is possible with straining with bowel movement. Will guaiac stools x2. Check CBC today

## 2013-06-19 ENCOUNTER — Encounter: Payer: Self-pay | Admitting: Geriatric Medicine

## 2013-06-19 ENCOUNTER — Non-Acute Institutional Stay (SKILLED_NURSING_FACILITY): Payer: Medicare Other | Admitting: Geriatric Medicine

## 2013-06-19 DIAGNOSIS — L8992 Pressure ulcer of unspecified site, stage 2: Secondary | ICD-10-CM

## 2013-06-19 DIAGNOSIS — L8990 Pressure ulcer of unspecified site, unspecified stage: Secondary | ICD-10-CM

## 2013-06-19 DIAGNOSIS — S82892G Other fracture of left lower leg, subsequent encounter for closed fracture with delayed healing: Secondary | ICD-10-CM

## 2013-06-19 DIAGNOSIS — K625 Hemorrhage of anus and rectum: Secondary | ICD-10-CM

## 2013-06-19 DIAGNOSIS — D509 Iron deficiency anemia, unspecified: Secondary | ICD-10-CM

## 2013-06-19 DIAGNOSIS — IMO0001 Reserved for inherently not codable concepts without codable children: Secondary | ICD-10-CM

## 2013-06-19 NOTE — Progress Notes (Signed)
Patient ID: Kimberly Gaines, female   DOB: 02-May-1920, 79 y.o.   MRN: [id number]  [facility] SN[facility] [phone])  Code Status: Living Will, [facility]  Contact Information   Name Relation Home Work [place] A Son [phone]   Biggins,Veronica Daughter [phone]   Lasharon, Dunivan [phone]  [phone]      Chief Complaint  Patient presents with  . Rectal Bleeding  . Anklefracture  . Lab review    HPI: This 79 year old female resident of [facility], Assisted Living section is residing in rehabilitation section since August 18 due to mobility issues related to her left ankle fracture.   Recent visits: 06/14/2013 Rectal bleeding Single episode of rectal bleeding last evening. No external hemorrhoids, no enlarged internal hemorrhoids apparent no irritation of the rectal anal mucosa is possible with straining with bowel movement. Will guaiac stools x2. Check CBC today  04/19/2013: Atrial fibrillation Remains irregular rhythm, rate well controlled. Continue current medications including anticoagulation  Rheumatoid arthritis(714.0) Joint pain is well managed, right hand with significant deformity, a pursestring the function at baseline status. Continue current medications  Pressure ulcer stage II Minimal change, wound base remains clean with granulation tissue wound edges are quite thin minimal contraction  Ankle fracture, left Patient is now status post removal of external external fixator with placement of plaster cast. No complaints of pain, toes left foot are warm and mobile. Remains nonweightbearing status, returns to Dr. Roda Shutters in approximately 2 weeks for repeat x-ray and reassessment of healing.  Iron deficiency anemia, unspecified Most recent labs stable, anemia likely related to renal disease. Continue  iron supplement  Chronic kidney disease (CKD), stage III  (moderate) GFR has ranged  27-31 the last 2 months.  Remains stage III, monitor at intervals.  Since last visit, patient returned to Dr. Roda Shutters. Ankle fracture is healed, patient is weight bearing as tolerated. PT/OT have begun working with weight bearing activities.  CBC returned with minimal change. There has been no further rectal bleeding. Patient continues with iron supplementation. BMP with improved BUN, mildly elevated creatinine.   Allergies  Allergen Reactions  . Aleve [Naproxen Sodium] Other (See Comments)    Stomach bleeding.    Medications Reviewed   Data Reviewed  Radiology Exams   [facility] X-Ray 12/24/2012:   X-ray right hip: Chronic changes, osteoarthritic changes. 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 Minimal osteoarthritis. Acute fracture lateral malleolus of acute fracture knew the medial malleolus. No subluxation, dislocation or lytic destructive lesion.    Cardiovascular exams 02/01/2013 EKG: Irregular rhythm, heart rate 139, no P waves, LVH  Laboratory Studies   [facility] lab, external 07/03/2012 CBC: Rbc 3.91, Hgb 10.9, Hct 33.8   Retic count 1.0   CMP: BUN 29, creatinine 1.43, otherwise normal   Lipids: Total cholesterol 186, triglycerides 84, HDL 71, LDL 98   TSH 8.017   Rheumatoid factor 329 (normal less than 14)   10/30/2012: WBC 10.8, hemoglobin 9.7, hematocrit 29.5, platelets 222   Glucose or glucose 77, BUN 32, creatinine 1.38, sodium 142, potassium 5.0 LFTs WNL albumin 3.3   TSH 0.83 12/25/2012 WBC 10.5, hemoglobin 9.54, hematacrit 28.5, platelets 570  Glucose 109, BUN 38, creatinine 2.02, sodium  138, potassium 5.1. LFTs WNL. Albumin 2.7  TSH 0.380   rheumatoid factor  315  Fe 27 01/03/2013 WBC 14.1, hgb 9.6, hct 29.2, Plt 464  Glu 88, BUN 37, Cr 1.65, Na 137, K+ 4.7 LFTs WNL. Alb 3.0  Fe 66 01/29/2013 WBC 11.0, hemoglobin 10.8, hematocrit 32.3, platelets 212.  Glucose 84, BUN 39, creatinine 1.81, sodium 143, potassium 4.4 01/31/2013: Iron 51  B12 371 8/26/ 2014 WBC 10.6, hemoglobin 9.8, hematocrit 29.3, platelets 278  Glucose 85, BUN 34, creatinine 1.64, sodium 140, potassium 5.0  TSH 2.19  Digoxin 1.0  04/16/2013 WBC 10.3, hemoglobin 10.9, hematocrit 32.7, platelet 242  Glucose 77, BUN 31, creatinine 1.62, sodium 143, potassium 4.4  Lab Results- [facility] 06/14/2013  Component Value   WBC 11.6   HGB 10.0*   HCT 31*   PLT 240       GLUCOSE 122   NA 143   K 3.8   CREATININE 1.7*   BUN 23*       Albumin 3.3          Other:    [facility] Ultrasound 12/24/2012 Bilateral lower extremity venous Doppler: Negative for DVT   Review of Systems  DATA OBTAINED: from patient, nurse, medical record GENERAL: Feels well.   No fevers, fatigue. Good appetite.  SKIN: No itch, rash   EYES: No eye pain, dryness or itching  No change in vision EARS: No earache, tinnitus, change in hearing NOSE: No congestion, drainage or bleeding MOUTH/THROAT: No mouth or tooth pain    No sore throat      No difficulty chewing or swallowing RESPIRATORY: No cough, wheezing, SOB CARDIAC: No chest pain, palpitations  No edema. GI: No abdominal pain  No N/V/D or constipation  No heartburn or reflux  GU: No dysuria, frequency or urgency  No change in urine volume or character    MUSCULOSKELETAL: No discomfort left ankle   No back pain  No muscle ache, pain, weakness   NEUROLOGIC: No dizziness, fainting, headache  No change in mental status.  PSYCHIATRIC: No feelings of anxiety, depression   Sleeps well.    Physical Exam Filed Vitals:   06/19/13 1046  BP: 131/79  Pulse: 93  Temp: 99.1 F (37.3 C)  Resp: 19  Weight: 170 lb 9.6 oz (77.384 kg)  SpO2: 96%    GENERAL APPEARANCE: No acute distress, appropriately groomed, normal body habitus. Alert, pleasant, conversant SKIN: No diaphoresis, rash, unusual lesions.  Wound rt. Bunion is superficial, clean    EYES: Conjunctiva/lids clear.  RESPIRATORY: Breathing is even, unlabored. Lung sounds are clear and full.  CARDIOVASCULAR: Heart IRRR. No murmur or extra heart sounds  VENOUS: No varicosities. No venous stasis skin changes  EDEMA: Trace edema bilateral feet/ankle. GASTROINTESTINAL: Abdomen is soft, non-tender, not distended w/ normal bowel sounds.  MUSCULOSKELETAL: Moves UE and Rt LE extremities with full ROM, strength and tone. Bilateral hands with arthritic deformity, right worse than left. Left lower leg / foot without CAM boot - slow, cautious ankle ROM. NEUROLOGIC: Oriented to time, place, person. Speech clear, no tremor.  PSYCHIATRIC: Mood and affect appropriate to situation  ASSESSMENT/PLAN  Rectal bleeding Resolved  Pressure ulcer stage II Continues very slow healing  Ankle fracture, left Ankle fracture is healed, patient is starting weight bearing activities with PT/OT. Minimal discomfort. Short term goal is safe, independent stand,pivot transfers. This will allow patient to return to her AL apartment.  Chronic kidney disease (CKD), stage III (moderate) Most recent creatinine a  bit elevated. Continue to monitor at intervals  Iron deficiency anemia, unspecified Mild decreased H/H, continue iron supplement.   Follow up: As needed     Ailyn Gladd T.Edem Tiegs, NP-C 06/19/2013

## 2013-06-23 NOTE — Assessment & Plan Note (Signed)
Most recent creatinine a bit elevated. Continue to monitor at intervals

## 2013-06-23 NOTE — Assessment & Plan Note (Signed)
Resolved

## 2013-06-23 NOTE — Assessment & Plan Note (Signed)
Mild decreased H/H, continue iron supplement.

## 2013-06-23 NOTE — Assessment & Plan Note (Signed)
Ankle fracture is healed, patient is starting weight bearing activities with PT/OT. Minimal discomfort. Short term goal is safe, independent stand,pivot transfers. This will allow patient to return to her AL apartment.

## 2013-06-23 NOTE — Assessment & Plan Note (Signed)
Continues very slow healing

## 2013-07-02 ENCOUNTER — Non-Acute Institutional Stay (SKILLED_NURSING_FACILITY): Payer: Medicare Other | Admitting: Geriatric Medicine

## 2013-07-02 ENCOUNTER — Encounter: Payer: Self-pay | Admitting: Geriatric Medicine

## 2013-07-02 DIAGNOSIS — S82892S Other fracture of left lower leg, sequela: Secondary | ICD-10-CM

## 2013-07-02 DIAGNOSIS — S8290XS Unspecified fracture of unspecified lower leg, sequela: Secondary | ICD-10-CM

## 2013-07-02 NOTE — Progress Notes (Signed)
Patient ID: Kimberly Gaines, female   DOB: [birth year], 80 y.o.   MRN: [id number]  [facility] SNF [phone])  Code Status: Living Will, [facility]  Contact Information   Name Relation Home Work [place] A Son [phone]   Gaines,Kimberly Daughter [phone]   Kimberly, Gaines [phone]  [phone]      Chief Complaint  Patient presents with  . Ankle Pain  . Debility    HPI: This 80 year old female resident of [facility], was transferred back to her assisted living apartment 06/25/2013 after extended stay in rehabilitation section related to left ankle fracture.   Last visit: Ankle fracture, left Ankle fracture is healed, patient is starting weight bearing activities with PT/OT. Minimal discomfort. Short term goal is safe, independent stand,pivot transfers. This will allow patient to return to her AL apartment.  Since return to AL patient has experienced increased left ankle pain. In the last 24 hour she's been unable to bear weight at all, has required where left for transferring. There's been no fall or other injury. Left ankle and foot x-ray was obtained last night, there is no evidence of new injury.  Patient was transferred back to the rehabilitation section today and she requires a higher level of care.  Allergies  Allergen Reactions  . Aleve [Naproxen Sodium] Other (See Comments)    Stomach bleeding.    MEDICATION -  Reviewed   DATA REVIEWED  Radiology Exams   [facility] X-Ray 12/24/2012:   X-ray right hip: Chronic changes, osteoarthritic changes. 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 Minimal osteoarthritis. Acute fracture lateral malleolus of acute fracture knew the medial malleolus. No subluxation, dislocation or lytic destructive lesion.   07/01/2012 x-ray of left ankle and foot. Healing fracture of distal tibia and fibula. Left foot with soft tissue edema   Cardiovascular exams 02/01/2013 EKG: Irregular rhythm, heart rate 139, no P waves, LVH  Laboratory Studies   [facility] lab, external 07/03/2012 CBC: Rbc 3.91, Hgb 10.9, Hct 33.8   Retic count 1.0   CMP: BUN 29, creatinine 1.43, otherwise normal   Lipids: Total cholesterol 186, triglycerides 84, HDL 71, LDL 98   TSH 8.017   Rheumatoid factor 329 (normal less than 14)   10/30/2012: WBC 10.8, hemoglobin 9.7, hematocrit 29.5, platelets 222   Glucose or glucose 77, BUN 32, creatinine 1.38, sodium 142, potassium 5.0 LFTs WNL albumin 3.3   TSH 0.83 12/25/2012 WBC 10.5, hemoglobin 9.54, hematacrit 28.5, platelets 570  Glucose 109, BUN 38, creatinine 2.02, sodium 138, potassium 5.1. LFTs WNL. Albumin 2.7  TSH 0.380   rheumatoid factor 315  Fe 27 01/03/2013 WBC 14.1, hgb 9.6, hct 29.2, Plt 464  Glu 88, BUN 37, Cr 1.65, Na 137, K+ 4.7 LFTs WNL. Alb 3.0  Fe 66 01/29/2013 WBC 11.0, hemoglobin 10.8, hematocrit 32.3, platelets 212.  Glucose 84, BUN 39, creatinine 1.81, sodium 143, potassium 4.4 01/31/2013: Iron 51  B12 371 8/26/ 2014 WBC 10.6, hemoglobin 9.8, hematocrit 29.3, platelets 278  Glucose 85, BUN 34, creatinine 1.64, sodium 140, potassium 5.0  TSH 2.19  Digoxin 1.0  04/16/2013 WBC 10.3, hemoglobin 10.9, hematocrit 32.7, platelet 242  Glucose 77, BUN 31, creatinine 1.62, sodium 143,  potassium 4.4  Lab Results- [facility] 06/14/2013  Component Value   WBC 11.6   HGB 10.0*   HCT 31*   PLT 240       GLUCOSE 122   NA 143   K 3.8   CREATININE 1.7*   BUN 23*       Albumin 3.3          Other:    [facility] Ultrasound 12/24/2012 Bilateral lower extremity venous Doppler: Negative for  DVT   REVIEW OF SYSTEMS DATA OBTAINED: from patient, nurse, medical record GENERAL: Feels "ok"   No fevers, fatigue. Good appetite.  SKIN: No itch, rash   RESPIRATORY: No cough, wheezing, SOB CARDIAC: No chest pain, palpitations  No edema. GI: No abdominal pain  No N/V/D or constipation  No heartburn or reflux  GU: No dysuria, frequency or urgency  No change in urine volume or character    MUSCULOSKELETAL: Mild discomfort left ankle while resting in bed   No back pain  No muscle ache, pain, weakness   NEUROLOGIC: No dizziness, fainting, headache  No change in mental status.  PSYCHIATRIC: No feelings of anxiety, depression   Sleeps well.    PHYSICAL EXAM   Filed Vitals:   07/02/13 1717  BP: 140/87  Pulse: 83  Temp: 97.4 F (36.3 C)  Resp: 20  SpO2: 99%   GENERAL APPEARANCE: No acute distress, appropriately groomed, normal body habitus. Alert, pleasant, conversant RESPIRATORY: Breathing is even, unlabored. Lung sounds are clear and full.  CARDIOVASCULAR: Heart IRRR. No murmur or extra heart sounds  VENOUS: No varicosities. No venous stasis skin changes  EDEMA: Trace edema rt. Foot/ankle GASTROINTESTINAL: Abdomen is soft, non-tender, not distended w/ normal bowel sounds.  MUSCULOSKELETAL: Moves UE and Rt LE extremities with full ROM, strength and tone. Bilateral hands with arthritic deformity, right worse than left. Left lower leg / foot with 1+ edema, tender in lateral ankle above the malleolus. NEUROLOGIC: Oriented to time, place, person. Speech clear, no tremor.  PSYCHIATRIC: Mood and affect appropriate to situation  ASSESSMENT/PLAN  Ankle fracture, left Patient's most recent orthopedic appointment evaluation showed fracture was healed. Patient began weightbearing as tolerated. She has developed discomfort in the lateral ankle. X-ray last night did not show any new fracture does indicate healing distal tib-fib fracture. There is some soft tissue swelling of the ankle and foot.  Increased pain and swelling may be due to increased activity. Recommend patient rest in bed today, will wrap left foot in ACE for support. Therapy will resume tomorrow with minimal weightbearing. Spent time with patient today (15-20 minutes) exploring current situation., She is very discouraged about return of pain and reduced functional status. Expresses concern she will not be able to return to her AL apartment. This will be addressed further once ankle situation stabilizes  Time: 25 minutes, >50% spent counseling/or care coordination  Follow up: As needed     Nylan Nakatani T.Menno Vanbergen, NP-C 07/02/2013

## 2013-07-02 NOTE — Assessment & Plan Note (Addendum)
Patient's most recent orthopedic appointment evaluation showed fracture was healed. Patient began weightbearing as tolerated. She has developed discomfort in the lateral ankle. X-ray last night did not show any new fracture does indicate healing distal tib-fib fracture. There is some soft tissue swelling of the ankle and foot. Increased pain and swelling may be due to increased activity. Recommend patient rest in bed today, will wrap left foot in ACE for support. Therapy will resume tomorrow with minimal weightbearing. Spent time with patient today (15-20 minutes) exploring current situation., She is very discouraged about return of pain and reduced functional status. Expresses concern she will not be able to return to her AL apartment. This will be addressed further once ankle situation stabilizes

## 2013-07-03 ENCOUNTER — Encounter: Payer: Medicare Other | Admitting: Geriatric Medicine

## 2013-07-25 ENCOUNTER — Non-Acute Institutional Stay (SKILLED_NURSING_FACILITY): Payer: Medicare Other | Admitting: Geriatric Medicine

## 2013-07-25 ENCOUNTER — Encounter: Payer: Self-pay | Admitting: Geriatric Medicine

## 2013-07-25 DIAGNOSIS — M069 Rheumatoid arthritis, unspecified: Secondary | ICD-10-CM

## 2013-07-25 DIAGNOSIS — I4891 Unspecified atrial fibrillation: Secondary | ICD-10-CM

## 2013-07-25 DIAGNOSIS — K922 Gastrointestinal hemorrhage, unspecified: Secondary | ICD-10-CM

## 2013-07-25 LAB — CBC AND DIFFERENTIAL
HCT: 30 % — AB (ref 36–46)
HCT: 30 % — AB (ref 36–46)
HEMOGLOBIN: 10 g/dL — AB (ref 12.0–16.0)
Hemoglobin: 10 g/dL — AB (ref 12.0–16.0)
Platelets: 260 10*3/uL (ref 150–399)
Platelets: 260 10*3/uL (ref 150–399)
WBC: 11.9 10*3/mL
WBC: 11.9 10*3/mL

## 2013-07-25 LAB — BASIC METABOLIC PANEL
BUN: 31 mg/dL — AB (ref 4–21)
CREATININE: 1.6 mg/dL — AB (ref 0.5–1.1)
Glucose: 131 mg/dL
Potassium: 4.4 mmol/L (ref 3.4–5.3)
Sodium: 140 mmol/L (ref 137–147)

## 2013-07-25 NOTE — Assessment & Plan Note (Addendum)
Heart RRR today, rate satisfactory. Anticoagulation stopped due to GI bleeding. Will have to reconsider utility of resuming anticoagulation.

## 2013-07-25 NOTE — Assessment & Plan Note (Signed)
Significant chronic multiple joint pain treated with prednisone , meloxicam. Pt. Now with GI bleed. Have stopped meloxicam, will resume prednisone tomorrow. Monitor for pain management.

## 2013-07-25 NOTE — Progress Notes (Signed)
Patient ID: Kimberly Gaines, female   DOB: August 20, 1919, 81 y.o.   MRN: [id number]  [facility] SN[facility] [phone])  Code Status: Living Will, [facility]  Contact Information   Name Relation Home Work [place] A Son [phone]   Biggins,Veronica Daughter [phone]   Jannel, Lynne [phone]  [phone]      Chief Complaint  Patient presents with  . Rectal Bleeding    HPI: This is a 80 y.o. female resident of [facility], currently residing in the Rehab section.  Evaluation is requested today due to rectal bleeding and vomiting. Patient reports she has not been feeling very well last few days; mild GI upset. She has not shared this with the nursing staff. This morning she had a 2 very large bloody bowel movements, nurse reports blood in his toilet was bright red along with clots. Patient also had 2 episodes of vomiting, the second positive for blood. Initially patient's blood pressure was in her normal range however repeat blood pressure shorter mildly hypotensive. She's currently not experiencing any abdominal pain, shortness of breath nausea. P.o. intake has been limited to sips of clear liquids so far today. All p.o. medications are on hold today, stat CBC and BMP labs were ordered, results below.   Allergies  Allergen Reactions  . Aleve [Naproxen Sodium] Other (See Comments)    Stomach bleeding.    MEDICATION -  Reviewed   DATA REVIEWED  Radiology Exams   [facility] X-Ray 12/24/2012:   X-ray right hip: Chronic changes, osteoarthritic changes. 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  Minimal osteoarthritis. Acute fracture lateral malleolus of acute fracture knew the medial malleolus. No subluxation, dislocation or lytic destructive lesion.   07/01/2012 x-ray of left ankle and foot. Healing fracture of distal tibia and fibula. Left foot with soft tissue edema   Cardiovascular exams 02/01/2013 EKG: Irregular rhythm, heart rate 139, no P waves, LVH  Laboratory Studies   [facility] lab, external 07/03/2012  Lipids: Total cholesterol 186, triglycerides 84, HDL 71, LDL 98   TSH 8.017   Rheumatoid factor 329 (normal less than 14)   10/30/2012:  TSH 0.83 12/25/2012  TSH 0.380   rheumatoid factor 315  Fe 27 01/31/2013: Iron 51  B12 371 8/26/ 2014 WBC 10.6, hemoglobin 9.8, hematocrit 29.3, platelets 278  Glucose 85, BUN 34, creatinine 1.64, sodium 140, potassium 5.0  TSH 2.19  Digoxin 1.0  04/16/2013 WBC 10.3, hemoglobin 10.9, hematocrit 32.7, platelet 242  Glucose 77, BUN 31, creatinine 1.62, sodium 143, potassium 4.4  Lab Results- [facility] 06/14/2013  Component Value   WBC 11.6   HGB 10.0*   HCT 31*   PLT 240       GLUCOSE 122   NA 143   K 3.8   CREATININE 1.7*   BUN 23*       Albumin 3.3       Lab Results  Component Value Date   WBC 11.9 07/25/2013   HGB 10.0* 07/25/2013   HCT 30* 07/25/2013   PLT 260 07/25/2013        GLUCOSE 131 03/01/2013   NA 140 07/25/2013   K 4.4 07/25/2013   CREATININE 1.6* 07/25/2013  BUN 31* 07/25/2013      Other:    [facility] Ultrasound 12/24/2012 Bilateral lower extremity venous Doppler: Negative for DVT   REVIEW OF SYSTEMS DATA OBTAINED: from patient, nurse, medical record GENERAL: Feels "ok"   No fevers, fatigue. Poor appetite last few days  SKIN: No itch, rash   RESPIRATORY: No cough, wheezing, SOB CARDIAC: No chest pain, palpitations  No edema. GI: No abdominal pain  No N/V/D or constipation  No heartburn or reflux - see history of present illness GU: No dysuria, frequency or urgency  No change in urine volume or  character    MUSCULOSKELETAL: Mild discomfort left ankle while resting in bed   No back pain  No muscle ache, pain, weakness   NEUROLOGIC: No dizziness, fainting, headache  No change in mental status.  PSYCHIATRIC: No feelings of anxiety, depression   Sleeps well.    PHYSICAL EXAM   Filed Vitals:   07/25/13 1115  BP: 95/62  Pulse: 91   GENERAL APPEARANCE: No acute distress, appropriately groomed, normal body habitus. Alert, pleasant, conversant RESPIRATORY: Breathing is even, unlabored. Lung sounds are clear and full.  CARDIOVASCULAR: Heart RRR. No murmur or extra heart sounds  VENOUS: No varicosities. No venous stasis skin changes  EDEMA: Trace edema rt. Foot/ankle GASTROINTESTINAL: Abdomen is soft, not distended w/ diminished  bowel sounds, mild epigastric tenderness  MUSCULOSKELETAL: Moves UE and Rt LE extremities with full ROM, strength and tone. Bilateral hands with arthritic deformity, right worse than left. Left lower leg / foot with 1+ edema, tender in lateral ankle above the malleolus.  NEUROLOGIC: Oriented to time, place, person. Speech clear, no tremor.  PSYCHIATRIC: Mood and affect appropriate to situation  ASSESSMENT/PLAN  Acute GI bleeding BRRB along with heme positive vomitus, mild hypotension. H/H unchanged since last month, BUN mildly elevated ,repeat tomorrow. Hold all po meds today, d/c  apixaban, meloxicam. Clear fluids today, advance diet and resume medications tomorrow if no further bleeding.  Atrial fibrillation Heart RRR today, rate satisfactory. Anticoagulation stopped due to GI bleeding. Will have to reconsider utility of resuming anticoagulation.  Rheumatoid arthritis(714.0) Significant chronic multiple joint pain treated with prednisone , meloxicam. Pt. Now with GI bleed. Have stopped meloxicam, will resume prednisone tomorrow. Monitor for pain management.   Follow up: As needed  07/26/2013: CBC, BMP   Tessi Eustache T.Xzayvier Fagin, NP-C 07/25/2013

## 2013-07-25 NOTE — Assessment & Plan Note (Addendum)
BRRB along with heme positive vomitus, mild hypotension. H/H unchanged since last month, BUN mildly elevated ,repeat tomorrow. Hold all po meds today, d/c  apixaban, meloxicam. Clear fluids today, advance diet and resume medications tomorrow if no further bleeding.

## 2013-07-26 ENCOUNTER — Encounter: Payer: Self-pay | Admitting: Geriatric Medicine

## 2013-07-26 ENCOUNTER — Non-Acute Institutional Stay (SKILLED_NURSING_FACILITY): Payer: Medicare Other | Admitting: Geriatric Medicine

## 2013-07-26 DIAGNOSIS — I4891 Unspecified atrial fibrillation: Secondary | ICD-10-CM

## 2013-07-26 DIAGNOSIS — K922 Gastrointestinal hemorrhage, unspecified: Secondary | ICD-10-CM

## 2013-07-26 LAB — BASIC METABOLIC PANEL
BUN: 26 mg/dL — AB (ref 4–21)
CREATININE: 1.5 mg/dL — AB (ref 0.5–1.1)
Glucose: 89 mg/dL
Potassium: 4.2 mmol/L (ref 3.4–5.3)
Sodium: 136 mmol/L — AB (ref 137–147)

## 2013-07-26 LAB — CBC AND DIFFERENTIAL
Hemoglobin: 9.1 g/dL — AB (ref 12.0–16.0)
PLATELETS: 225 10*3/uL (ref 150–399)
WBC: 10.2 10*3/mL

## 2013-07-26 NOTE — Assessment & Plan Note (Signed)
No further active GI bleeding. Mild nausea earlier this morning, no vomiting. Able to keep small amount of lunch this afternoon. H&H with mild decline, repeat CBC 07/30/13

## 2013-07-26 NOTE — Progress Notes (Signed)
Patient ID: ANNALIAH RIVENBARK, female   DOB: Sep 25, 1919, 80 y.o.   MRN: [id number]   Patient ID: WYLENE WEISSMAN, female   DOB: 02-18-1920, 80 y.o.   MRN: [id number]  [facility] [phone])  Code Status: Living Will, [facility]  Contact Information   Name Relation Home Work [place] A Son [phone]   Biggins,Veronica Daughter [phone]   Elvenia, Godden [phone]  [phone]      Chief Complaint  Patient presents with  . GI Bleeding    HPI: This is a 80 y.o. female resident of [facility], currently residing in the Rehab section.   Last visit:  Acute GI bleeding BRRB along with heme positive vomitus, mild hypotension. H/H unchanged since last month, BUN mildly elevated ,repeat tomorrow. Hold all po meds today, d/c  apixaban, meloxicam. Clear fluids today, advance diet and resume medications tomorrow if no further bleeding.  Atrial fibrillation Heart RRR today, rate satisfactory. Anticoagulation stopped due to GI bleeding. Will have to reconsider utility of resuming anticoagulation.  Rheumatoid arthritis(714.0) Significant chronic multiple joint pain treated with prednisone , meloxicam. Pt. Now with GI bleed. Have stopped meloxicam, will resume prednisone tomorrow. Monitor for pain management.  Since last visit there's been no further rectal bleeding, no vomiting. No BM so far today. Earlier today patient reports mild nausea but this passed and she has been able to eat small amounts. Is tolerating fluids very well.   Repeat labs today show mild decrease in hemoglobin hematocrit, electrolytes are stable. A significant increase in pain off meloxicam. Prednisone was resumed today  Allergies  Allergen Reactions  . Aleve [Naproxen Sodium] Other (See Comments)    Stomach bleeding.    MEDICATION -  Reviewed   DATA REVIEWED  Radiology Exams   [facility]  X-Ray 12/24/2012:   X-ray right hip: Chronic changes, osteoarthritic changes. 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 Minimal osteoarthritis. Acute fracture lateral malleolus of acute fracture knew the medial malleolus. No subluxation, dislocation or lytic destructive lesion.   07/01/2012 x-ray of left ankle and foot. Healing fracture of distal tibia and fibula. Left foot with soft tissue edema   Cardiovascular exams 02/01/2013 EKG: Irregular rhythm, heart rate 139, no P waves, LVH  Laboratory Studies   [facility] lab, external 07/03/2012  Lipids: Total cholesterol 186, triglycerides 84, HDL 71, LDL 98   TSH 8.017   Rheumatoid factor 329 (normal less than 14)   10/30/2012:  TSH 0.83 12/25/2012  TSH 0.380   rheumatoid factor 315  Fe 27 01/31/2013: Iron 51  B12 371 8/26/ 2014 TSH 2.19   Digoxin 1.0   Lab Results- [facility] 06/14/2013  Component Value   WBC 11.6   HGB 10.0*   HCT 31*   PLT 240       GLUCOSE 122   NA 143   K 3.8   CREATININE 1.7*   BUN 23*       Albumin 3.3       Lab Results- [facility] 07/25/2013  Component Value   WBC 11.9   HGB 10.0*   HCT 30*   PLT 260       GLUCOSE 131   NA 140   K  4.4   CREATININE 1.6*   BUN 31*   Lab Results [facility] 07/26/2013  Component Value Date   WBC 10.2 07/26/2013   HGB 9.1* 07/26/2013   HCT 27.3 07/25/2013   PLT 225 07/26/2013        GLUCOSE 89 03/01/2013   NA 136* 07/26/2013   K 4.2 07/26/2013   CREATININE 1.5* 07/26/2013   BUN 26* 07/26/2013     Other:    [facility] Ultrasound 12/24/2012 Bilateral lower extremity venous Doppler: Negative for DVT   REVIEW OF SYSTEMS DATA OBTAINED: from patient, nurse, medical record GENERAL: Feels "ok"    No fevers, fatigue. Little appetite, tolerated small amounts of solid food without vomiting SKIN: No itch, rash   RESPIRATORY: No cough, wheezing, SOB CARDIAC: No chest pain, palpitations  No edema. GI: No abdominal pain  No N/V/D   No heartburn or reflux - see history of present illness GU: No dysuria, frequency or urgency  No change in urine volume or character    MUSCULOSKELETAL: Mild discomfort left ankle while resting in bed   No back pain  No muscle ache, pain, weakness   NEUROLOGIC: No dizziness, fainting, headache  No change in mental status.  PSYCHIATRIC: No feelings of anxiety, depression   Sleeps well.    PHYSICAL EXAM   Filed Vitals:   07/26/13 1403  BP: 123/67  Pulse: 85  Temp: 98.5 F (36.9 C)  Resp: 18  SpO2: 95%   GENERAL APPEARANCE: No acute distress, appropriately groomed, normal body habitus. Alert, pleasant, conversant RESPIRATORY: Breathing is even, unlabored. Lung sounds are clear and full.  CARDIOVASCULAR: Heart RRR. No murmur or extra heart sounds  VENOUS: No varicosities. No venous stasis skin changes  EDEMA: Trace edema rt. Foot/ankle GASTROINTESTINAL: Abdomen is soft, not distended w/ diminished  bowel sounds, mild epigastric tenderness  MUSCULOSKELETAL: Moves UE and Rt LE extremities with full ROM, strength and tone. Bilateral hands with arthritic deformity, right worse than left. Left lower leg / foot with 1+ edema, tender in lateral ankle above the malleolus.  NEUROLOGIC: Oriented to time, place, person. Speech clear, no tremor.  PSYCHIATRIC: Mood and affect appropriate to situation  ASSESSMENT/PLAN  Acute GI bleeding No further active GI bleeding. Mild nausea earlier this morning, no vomiting. Able to keep small amount of lunch this afternoon. H&H with mild decline, repeat CBC 07/30/13  Atrial fibrillation Heart remains in regular rhythm today, rate well controlled. Anticoagulation has been stopped due to GI bleed   Follow up: As needed  Lab  07/30/2013 CBC, BMP   Vaughn Beaumier T.Valita Righter, NP-C 07/26/2013

## 2013-07-27 NOTE — Assessment & Plan Note (Signed)
Heart remains in regular rhythm today, rate well controlled. Anticoagulation has been stopped due to GI bleed

## 2013-07-30 LAB — BASIC METABOLIC PANEL
BUN: 24 mg/dL — AB (ref 4–21)
Creatinine: 1.4 mg/dL — AB (ref 0.5–1.1)
GLUCOSE: 81 mg/dL
POTASSIUM: 3.9 mmol/L (ref 3.4–5.3)
SODIUM: 141 mmol/L (ref 137–147)

## 2013-07-30 LAB — CBC AND DIFFERENTIAL
HCT: 26 % — AB (ref 36–46)
Hemoglobin: 8.5 g/dL — AB (ref 12.0–16.0)
Platelets: 240 10*3/uL (ref 150–399)
WBC: 9 10^3/mL

## 2013-08-06 LAB — CBC AND DIFFERENTIAL
HCT: 31 % — AB (ref 36–46)
Hemoglobin: 10.1 g/dL — AB (ref 12.0–16.0)
Platelets: 241 10*3/uL (ref 150–399)
WBC: 9.5 10^3/mL

## 2013-08-06 LAB — BASIC METABOLIC PANEL
BUN: 22 mg/dL — AB (ref 4–21)
CREATININE: 1.4 mg/dL — AB (ref 0.5–1.1)
Glucose: 79 mg/dL
POTASSIUM: 3.9 mmol/L (ref 3.4–5.3)
Sodium: 142 mmol/L (ref 137–147)

## 2013-08-09 ENCOUNTER — Non-Acute Institutional Stay (SKILLED_NURSING_FACILITY): Payer: Medicare Other | Admitting: Geriatric Medicine

## 2013-08-09 ENCOUNTER — Encounter: Payer: Self-pay | Admitting: Geriatric Medicine

## 2013-08-09 DIAGNOSIS — N390 Urinary tract infection, site not specified: Secondary | ICD-10-CM

## 2013-08-09 DIAGNOSIS — S82899A Other fracture of unspecified lower leg, initial encounter for closed fracture: Secondary | ICD-10-CM

## 2013-08-09 DIAGNOSIS — S82892A Other fracture of left lower leg, initial encounter for closed fracture: Secondary | ICD-10-CM

## 2013-08-09 DIAGNOSIS — K922 Gastrointestinal hemorrhage, unspecified: Secondary | ICD-10-CM

## 2013-08-09 NOTE — Assessment & Plan Note (Signed)
GI bleeding episode has resolved. Patient is eating and drinking as usual, bowel function is regular. Most recent lab with improved hemoglobin and hematocrit.

## 2013-08-09 NOTE — Assessment & Plan Note (Signed)
Patient is continuing with limited weightbearing on her left ankle. PT continues to work assisting patient to maximize strength and function. Awaiting AFO to aid in ankle stability.

## 2013-08-09 NOTE — Assessment & Plan Note (Signed)
Patient with symptoms of frequency and dysuria earlier this week, you may suggestive of infection, p.o. antibiotic was started. Her culture returned today positive for Klebsiella pneumonia. Continue Cipro for a full seven-day course.

## 2013-08-09 NOTE — Progress Notes (Signed)
P  Patient ID: Kimberly Gaines, female   DOB: 1920/01/12, 79 y.o.   MRN: [id number]  [facility] [phone])  Code Status: Living Will, [facility]  Contact Information   Name Relation Home Work [place] A Son [phone]   Biggins,Veronica Daughter [phone]   Hetvi, Shawhan [phone]  [phone]      Chief Complaint  Patient presents with  . Urinary Tract Infection  . GI Bleeding  . Ankle Injury    HPI: This is a 80 y.o. female resident of [facility], currently residing in the Rehab section.    Last visit:  Acute GI bleeding No further active GI bleeding. Mild nausea earlier this morning, no vomiting. Able to keep small amount of lunch this afternoon. H&H with mild decline, repeat CBC 07/30/13  Atrial fibrillation Heart remains in regular rhythm today, rate well controlled. Anticoagulation has been stopped due to GI bleed  Since last visit, patient has not had further rectal bleeding. Most recent lab with improvement in hemoglobin and hematocrit. Electrolytes and renal function remains stable. Patient did develop symptoms consistent with UTI earlier this week, urinalysis was positive for nitrites and leukocyte Estrace as well as many bacteria. Cipro was started empirically. Cultures report returned this morning positive for Klebsiella pneumoniae, sensitive to Cipro. Patient is currently asymptomatic. The patient's left ankle discomfort and instability continue to limit her mobility. She continues to wait for AFO. This device has been ordered. PT continued to work with patient on Gen. Patent examiner. Staff is assisting patient to transfer with a mechanical lift.  Allergies  Allergen Reactions  . Aleve [Naproxen Sodium] Other (See Comments)    Stomach bleeding.    MEDICATION -  Reviewed   DATA REVIEWED  Radiology Exams   [facility]  X-Ray 12/24/2012:   X-ray right hip: Chronic changes, osteoarthritic changes. 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 Minimal osteoarthritis. Acute fracture lateral malleolus of acute fracture knew the medial malleolus. No subluxation, dislocation or lytic destructive lesion.   07/01/2012 x-ray of left ankle and foot. Healing fracture of distal tibia and fibula. Left foot with soft tissue edema   Cardiovascular exams 02/01/2013 EKG: Irregular rhythm, heart rate 139, no P waves, LVH  Laboratory Studies   [facility] lab, external 07/03/2012  Lipids: Total cholesterol 186, triglycerides 84, HDL 71, LDL 98   TSH 8.017   Rheumatoid factor 329 (normal less than 14)   10/30/2012:  TSH 0.83 12/25/2012  TSH 0.380   rheumatoid factor 315  Fe 27 01/31/2013: Iron 51  B12 371 8/26/ 2014 TSH 2.19   Digoxin 1.0   Lab Results- [facility] 07/25/2013  Component Value   WBC 11.9   HGB 10.0*   HCT 30*   PLT 260       GLUCOSE 131   NA 140   K 4.4   CREATININE 1.6*   BUN 31*   Lab Results [facility] 07/26/2013  Component Value Date   WBC 10.2 07/26/2013   HGB 9.1* 07/26/2013   HCT 27.3 07/25/2013   PLT 225 07/26/2013        GLUCOSE 89 03/01/2013   NA 136* 07/26/2013   K 4.2 07/26/2013   CREATININE  1.5* 07/26/2013   BUN 26* 07/26/2013   Lab Results  Component Value Date   WBC 9.0 07/30/2013   HGB 8.5* 07/30/2013   HCT 26* 07/30/2013   MCV 94.9 02/28/2013   PLT 240 07/30/2013   Lab Results  Component Value Date   NA 141 07/30/2013   K 3.9 07/30/2013   GLU 81 07/30/2013   BUN 24* 07/30/2013   CREATININE 1.4* 07/30/2013    08/09/2103 urine culture: >100,000 Klebsiella pneumoniae   Lab Results  Component Value Date   WBC 9.5 08/06/2013    HGB 10.1* 08/06/2013   HCT 31* 08/06/2013   MCV 94.9 02/28/2013   PLT 241 08/06/2013   Lab Results  Component Value Date   NA 142 08/06/2013   K 3.9 08/06/2013   GLU 79 08/06/2013   BUN 22* 08/06/2013   CREATININE 1.4* 08/06/2013      Other:    [facility] Ultrasound 12/24/2012 Bilateral lower extremity venous Doppler: Negative for DVT   REVIEW OF SYSTEMS DATA OBTAINED: from patient, nurse, medical record GENERAL: Feels well, no c/o.   No fevers, fatigue. Appetite is better, mild weight loss (desirable) SKIN: No itch, rash   RESPIRATORY: No cough, wheezing, SOB CARDIAC: No chest pain, palpitations  No edema. GI: No abdominal pain  No N/V/D, constipation   No heartburn or reflux - GU: No dysuria, frequency or urgency  No change in urine volume or character    MUSCULOSKELETAL: No discomfort left ankle today. Foot is in brace,  No back pain  No muscle ache, pain, weakness   NEUROLOGIC: No dizziness, fainting, headache  No change in mental status.  PSYCHIATRIC: No feelings of anxiety, depression   Sleeps well.    PHYSICAL EXAM   Filed Vitals:   08/09/13 1159  BP: 137/73  Pulse: 68  Temp: 97.1 F (36.2 C)  Resp: 20  Weight: 167 lb (75.751 kg)  SpO2: 98%   GENERAL APPEARANCE: No acute distress, appropriately groomed, normal body habitus. Alert, pleasant, conversant RESPIRATORY: Breathing is even, unlabored. Lung sounds are clear and full.  CARDIOVASCULAR: Heart RRR. No murmur or extra heart sounds  VENOUS: No varicosities. No venous stasis skin changes  EDEMA: Trace edema Bilateral Feet/ankles, compression hose on today GASTROINTESTINAL: Abdomen is soft, nontender, not distended w/ bowel sounds MUSCULOSKELETAL: Moves UE and Rt LE extremities with full ROM, strength and tone. Bilateral hands with arthritic deformity, right worse than left. NEUROLOGIC: Oriented to time, place, person. Speech clear, no tremor.  PSYCHIATRIC: Mood and affect appropriate to  situation  ASSESSMENT/PLAN  Acute GI bleeding GI bleeding episode has resolved. Patient is eating and drinking as usual, bowel function is regular. Most recent lab with improved hemoglobin and hematocrit.  Ankle fracture, left Patient is continuing with limited weightbearing on her left ankle. PT continues to work assisting patient to maximize strength and function. Awaiting AFO to aid in ankle stability.  Urinary tract infection, site not specified Patient with symptoms of frequency and dysuria earlier this week, you may suggestive of infection, p.o. antibiotic was started. Her culture returned today positive for Klebsiella pneumonia. Continue Cipro for a full seven-day course.   Follow up: As needed     Toribio Harbour, NP-C [facility] [phone]  08/09/2013

## 2013-09-02 ENCOUNTER — Encounter: Payer: Self-pay | Admitting: Geriatric Medicine

## 2013-09-02 ENCOUNTER — Non-Acute Institutional Stay (SKILLED_NURSING_FACILITY): Payer: Medicare Other | Admitting: Geriatric Medicine

## 2013-09-02 DIAGNOSIS — S82892A Other fracture of left lower leg, initial encounter for closed fracture: Secondary | ICD-10-CM

## 2013-09-02 DIAGNOSIS — S82899A Other fracture of unspecified lower leg, initial encounter for closed fracture: Secondary | ICD-10-CM

## 2013-09-02 DIAGNOSIS — I4891 Unspecified atrial fibrillation: Secondary | ICD-10-CM

## 2013-09-02 DIAGNOSIS — D509 Iron deficiency anemia, unspecified: Secondary | ICD-10-CM

## 2013-09-02 DIAGNOSIS — R609 Edema, unspecified: Secondary | ICD-10-CM

## 2013-09-02 NOTE — Assessment & Plan Note (Signed)
Ltd. mobility and stability of left ankle. Await further adjustment of AFO to maximize her function  With transfers and short distance ambulation

## 2013-09-02 NOTE — Assessment & Plan Note (Addendum)
Stable, tolerates compression hose. Continue current diuretic

## 2013-09-02 NOTE — Assessment & Plan Note (Signed)
No signs of bleeding, patient continues with iron supplementation. Update CBC

## 2013-09-02 NOTE — Assessment & Plan Note (Signed)
Heart continues irregular rhythm, rate well controlled. Continue current medications. No anticoagulation due to recent GI bleed.

## 2013-09-02 NOTE — Progress Notes (Signed)
Patient ID: Kimberly Gaines, female   DOB: Jan 26, 1920, 80 y.o.   MRN: [id number]  [facility] [phone])  Code Status: Living Will, [facility]  Contact Information   Name Relation Home Work [place] A Son [phone]   Biggins,Veronica Daughter [phone]   Christean, Silvestri [phone]  [phone]      Chief Complaint  Patient presents with  . Medical Managment of Chronic Issues    HPI: This is a 80 y.o. female resident of [facility], currently residing in the Rehab section.    Last visit:  Acute GI bleeding GI bleeding episode has resolved. Patient is eating and drinking as usual, bowel function is regular. Most recent lab with improved hemoglobin and hematocrit.  Ankle fracture, left Patient is continuing with limited weightbearing on her left ankle. PT continues to work assisting patient to maximize strength and function. Awaiting AFO to aid in ankle stability.  Urinary tract infection, site not specified Patient with symptoms of frequency and dysuria earlier this week, you may suggestive of infection, p.o. antibiotic was started. Her culture returned today positive for Klebsiella pneumonia. Continue Cipro for a full seven-day course.  Since last visit, patient has had no recurrence of GI bleeding. Continues to eat and drink satisfactorily, bowel function is regular. Patient completed course of Cipro for UTI, no return of urinary symptoms. Patient continues to have limited weightbearing on her left ankle to to pain. She has been waiting for an AFO to aid in ankle stability, brace was brought last week, unfortunately still requires additional adjustment. Patient continues to work with PT and OT within her limitations. Decision has been made for permanent Skilled nursing placement for this patient, she expects to move to her new room in the next week.  Allergies   Allergen Reactions  . Aleve [Naproxen Sodium] Other (See Comments)    Stomach bleeding.    MEDICATION -  Reviewed   DATA REVIEWED  Radiology Exams   [facility] X-Ray 12/24/2012:   X-ray right hip: Chronic changes, osteoarthritic changes. 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 Minimal osteoarthritis. Acute fracture lateral malleolus of acute fracture knew the medial malleolus. No subluxation, dislocation or lytic destructive lesion.   07/01/2012 x-ray of left ankle and foot. Healing fracture of distal tibia and fibula. Left foot with soft tissue edema   Cardiovascular exams 02/01/2013 EKG: Irregular rhythm, heart rate 139, no P waves, LVH  Laboratory Studies   [facility] lab, external 07/03/2012  Lipids: Total cholesterol 186, triglycerides 84, HDL 71, LDL 98   TSH 8.017   Rheumatoid factor 329 (normal less than 14)   10/30/2012:  TSH 0.83 12/25/2012  TSH 0.380   rheumatoid factor 315  Fe 27 01/31/2013: Iron 51  B12 371 8/26/ 2014 TSH 2.19   Digoxin 1.0   Lab Results [facility] 07/26/2013  Component Value Date   WBC 10.2 07/26/2013   HGB 9.1* 07/26/2013   HCT 27.3 07/25/2013   PLT 225 07/26/2013        GLUCOSE 89 03/01/2013   NA 136* 07/26/2013   K 4.2 07/26/2013   CREATININE 1.5* 07/26/2013   BUN 26* 07/26/2013   Lab Results  Component Value Date  WBC 9.5 08/06/2013   HGB 10.1* 08/06/2013   HCT 31* 08/06/2013   MCV 94.9 02/28/2013   PLT 241 08/06/2013   Lab Results  Component Value Date   NA 142 08/06/2013   K 3.9 08/06/2013   GLU 79 08/06/2013   BUN 22* 08/06/2013   CREATININE 1.4* 08/06/2013    08/09/2103 urine culture: >100,000 Klebsiella pneumoniae    Other:    [facility]  Ultrasound 12/24/2012 Bilateral lower extremity venous Doppler: Negative for DVT   REVIEW OF SYSTEMS DATA OBTAINED: from patient, nurse, medical record GENERAL: Feels well, no c/o.   No fevers, fatigue. Appetite is better, weight stable  SKIN: No itch, rash   RESPIRATORY: No cough, wheezing, SOB CARDIAC: No chest pain, palpitations  No edema. GI: No abdominal pain  No N/V/D, constipation   No heartburn or reflux - GU: No dysuria, frequency or urgency  No change in urine volume or character    MUSCULOSKELETAL: No discomfort left ankle today.   No back pain  No muscle ache, pain, weakness   NEUROLOGIC: No dizziness, fainting, headache  No change in mental status.  PSYCHIATRIC: No feelings of anxiety, depression   Sleeps well.    PHYSICAL EXAM   Filed Vitals:   09/02/13 1437  BP: 144/74  Pulse: 93  Weight: 167 lb 3.2 oz (75.841 kg)  Body mass index is 27 kg/(m^2).  GENERAL APPEARANCE: No acute distress, appropriately groomed, normal body habitus. Alert, pleasant, conversant RESPIRATORY: Breathing is even, unlabored. Lung sounds are clear and full.  CARDIOVASCULAR: Heart RRR. No murmur or extra heart sounds  VENOUS: No varicosities. No venous stasis skin changes  EDEMA: Trace edema Bilateral Feet/ankles, compression hose on today GASTROINTESTINAL: Abdomen is soft, nontender, not distended w/ active bowel sounds MUSCULOSKELETAL: Moves UE and Rt LE extremities with full ROM, strength and tone. Bilateral hands with arthritic deformity, right worse than left. NEUROLOGIC: Oriented to time, place, person. Speech clear, no tremor.  PSYCHIATRIC: Mood and affect appropriate to situation  ASSESSMENT/PLAN  Ankle fracture, left Ltd. mobility and stability of left ankle. Await further adjustment of AFO to maximize her function  With transfers and short distance ambulation  Iron deficiency anemia, unspecified No signs of bleeding, patient continues with iron supplementation. Update  CBC  Edema Stable, tolerates compression hose. Continue current diuretic  Atrial fibrillation  Heart continues irregular rhythm, rate well controlled. Continue current medications. No anticoagulation due to recent GI bleed.   Follow up: Routine ans as needed  Lab 09/03/2013 CBC      Janalynn Eder T.Antanasia Kaczynski, NP-C [facility] [phone]  09/02/2013

## 2013-09-03 LAB — CBC AND DIFFERENTIAL
HEMATOCRIT: 30 % — AB (ref 36–46)
HEMOGLOBIN: 10.2 g/dL — AB (ref 12.0–16.0)
Platelets: 227 10*3/uL (ref 150–399)
WBC: 9.7 10^3/mL

## 2013-09-12 ENCOUNTER — Emergency Department (HOSPITAL_COMMUNITY)
Admission: EM | Admit: 2013-09-12 | Discharge: 2013-09-25 | Disposition: E | Payer: Medicare Other | Attending: Emergency Medicine | Admitting: Emergency Medicine

## 2013-09-12 ENCOUNTER — Non-Acute Institutional Stay (SKILLED_NURSING_FACILITY): Payer: Medicare Other | Admitting: Geriatric Medicine

## 2013-09-12 ENCOUNTER — Emergency Department (HOSPITAL_COMMUNITY): Payer: Medicare Other

## 2013-09-12 ENCOUNTER — Encounter (HOSPITAL_COMMUNITY): Payer: Self-pay | Admitting: Emergency Medicine

## 2013-09-12 DIAGNOSIS — Z9849 Cataract extraction status, unspecified eye: Secondary | ICD-10-CM | POA: Insufficient documentation

## 2013-09-12 DIAGNOSIS — Z961 Presence of intraocular lens: Secondary | ICD-10-CM | POA: Insufficient documentation

## 2013-09-12 DIAGNOSIS — K2100 Gastro-esophageal reflux disease with esophagitis, without bleeding: Secondary | ICD-10-CM | POA: Insufficient documentation

## 2013-09-12 DIAGNOSIS — I10 Essential (primary) hypertension: Secondary | ICD-10-CM | POA: Insufficient documentation

## 2013-09-12 DIAGNOSIS — M069 Rheumatoid arthritis, unspecified: Secondary | ICD-10-CM | POA: Insufficient documentation

## 2013-09-12 DIAGNOSIS — Z8669 Personal history of other diseases of the nervous system and sense organs: Secondary | ICD-10-CM | POA: Insufficient documentation

## 2013-09-12 DIAGNOSIS — I5032 Chronic diastolic (congestive) heart failure: Secondary | ICD-10-CM | POA: Insufficient documentation

## 2013-09-12 DIAGNOSIS — E039 Hypothyroidism, unspecified: Secondary | ICD-10-CM | POA: Insufficient documentation

## 2013-09-12 DIAGNOSIS — Z8781 Personal history of (healed) traumatic fracture: Secondary | ICD-10-CM | POA: Insufficient documentation

## 2013-09-12 DIAGNOSIS — R079 Chest pain, unspecified: Secondary | ICD-10-CM

## 2013-09-12 DIAGNOSIS — Z853 Personal history of malignant neoplasm of breast: Secondary | ICD-10-CM | POA: Insufficient documentation

## 2013-09-12 DIAGNOSIS — Z79899 Other long term (current) drug therapy: Secondary | ICD-10-CM | POA: Insufficient documentation

## 2013-09-12 DIAGNOSIS — M810 Age-related osteoporosis without current pathological fracture: Secondary | ICD-10-CM | POA: Insufficient documentation

## 2013-09-12 DIAGNOSIS — IMO0002 Reserved for concepts with insufficient information to code with codable children: Secondary | ICD-10-CM | POA: Insufficient documentation

## 2013-09-12 DIAGNOSIS — Z8742 Personal history of other diseases of the female genital tract: Secondary | ICD-10-CM | POA: Insufficient documentation

## 2013-09-12 DIAGNOSIS — I4891 Unspecified atrial fibrillation: Secondary | ICD-10-CM | POA: Insufficient documentation

## 2013-09-12 DIAGNOSIS — K5900 Constipation, unspecified: Secondary | ICD-10-CM | POA: Insufficient documentation

## 2013-09-12 DIAGNOSIS — F0390 Unspecified dementia without behavioral disturbance: Secondary | ICD-10-CM | POA: Insufficient documentation

## 2013-09-12 DIAGNOSIS — R609 Edema, unspecified: Secondary | ICD-10-CM | POA: Insufficient documentation

## 2013-09-12 DIAGNOSIS — D638 Anemia in other chronic diseases classified elsewhere: Secondary | ICD-10-CM | POA: Insufficient documentation

## 2013-09-12 DIAGNOSIS — M1710 Unilateral primary osteoarthritis, unspecified knee: Secondary | ICD-10-CM | POA: Insufficient documentation

## 2013-09-12 DIAGNOSIS — D509 Iron deficiency anemia, unspecified: Secondary | ICD-10-CM | POA: Insufficient documentation

## 2013-09-12 DIAGNOSIS — I469 Cardiac arrest, cause unspecified: Secondary | ICD-10-CM | POA: Insufficient documentation

## 2013-09-12 DIAGNOSIS — Z87440 Personal history of urinary (tract) infections: Secondary | ICD-10-CM | POA: Insufficient documentation

## 2013-09-12 LAB — CBC WITH DIFFERENTIAL/PLATELET
BASOS PCT: 1 % (ref 0–1)
Basophils Absolute: 0.2 10*3/uL — ABNORMAL HIGH (ref 0.0–0.1)
EOS ABS: 0.2 10*3/uL (ref 0.0–0.7)
Eosinophils Relative: 1 % (ref 0–5)
HEMATOCRIT: 39.8 % (ref 36.0–46.0)
HEMOGLOBIN: 12.3 g/dL (ref 12.0–15.0)
Lymphocytes Relative: 33 % (ref 12–46)
Lymphs Abs: 6.8 10*3/uL — ABNORMAL HIGH (ref 0.7–4.0)
MCH: 31.9 pg (ref 26.0–34.0)
MCHC: 30.9 g/dL (ref 30.0–36.0)
MCV: 103.1 fL — AB (ref 78.0–100.0)
MONO ABS: 1.4 10*3/uL — AB (ref 0.1–1.0)
Monocytes Relative: 7 % (ref 3–12)
NEUTROS ABS: 12 10*3/uL — AB (ref 1.7–7.7)
Neutrophils Relative %: 58 % (ref 43–77)
Platelets: 270 10*3/uL (ref 150–400)
RBC: 3.86 MIL/uL — AB (ref 3.87–5.11)
RDW: 15.7 % — ABNORMAL HIGH (ref 11.5–15.5)
WBC: 20.6 10*3/uL — ABNORMAL HIGH (ref 4.0–10.5)

## 2013-09-12 LAB — COMPREHENSIVE METABOLIC PANEL
ALT: 39 U/L — ABNORMAL HIGH (ref 0–35)
AST: 50 U/L — ABNORMAL HIGH (ref 0–37)
Albumin: 3.2 g/dL — ABNORMAL LOW (ref 3.5–5.2)
Alkaline Phosphatase: 88 U/L (ref 39–117)
BUN: 24 mg/dL — AB (ref 6–23)
CALCIUM: 10.2 mg/dL (ref 8.4–10.5)
CO2: 17 mEq/L — ABNORMAL LOW (ref 19–32)
Chloride: 91 mEq/L — ABNORMAL LOW (ref 96–112)
Creatinine, Ser: 1.57 mg/dL — ABNORMAL HIGH (ref 0.50–1.10)
GFR, EST AFRICAN AMERICAN: 32 mL/min — AB (ref >90)
GFR, EST NON AFRICAN AMERICAN: 28 mL/min — AB (ref >90)
GLUCOSE: 369 mg/dL — AB (ref 70–99)
Potassium: 5.2 mEq/L (ref 3.7–5.3)
Sodium: 138 mEq/L (ref 137–147)
Total Bilirubin: 0.3 mg/dL (ref 0.3–1.2)
Total Protein: 6.9 g/dL (ref 6.0–8.3)

## 2013-09-12 LAB — DIGOXIN LEVEL: Digoxin Level: 0.9 ng/mL (ref 0.8–2.0)

## 2013-09-12 LAB — I-STAT TROPONIN, ED: Troponin i, poc: 0.15 ng/mL (ref 0.00–0.08)

## 2013-09-12 LAB — PRO B NATRIURETIC PEPTIDE: Pro B Natriuretic peptide (BNP): 599.4 pg/mL — ABNORMAL HIGH (ref 0–450)

## 2013-09-12 MED ORDER — FUROSEMIDE 10 MG/ML IJ SOLN
40.0000 mg | Freq: Once | INTRAMUSCULAR | Status: DC
  Filled 2013-09-12: qty 4

## 2013-09-12 MED ORDER — ROCURONIUM BROMIDE 50 MG/5ML IV SOLN
INTRAVENOUS | Status: AC
  Filled 2013-09-12: qty 2

## 2013-09-12 MED ORDER — DEXTROSE 50 % IV SOLN
INTRAVENOUS | Status: AC | PRN
  Administered 2013-09-12: 1 via INTRAVENOUS

## 2013-09-12 MED ORDER — SODIUM BICARBONATE 8.4 % IV SOLN
INTRAVENOUS | Status: AC | PRN
  Administered 2013-09-12: 50 meq via INTRAVENOUS

## 2013-09-12 MED ORDER — EPINEPHRINE HCL 0.1 MG/ML IJ SOSY
PREFILLED_SYRINGE | INTRAMUSCULAR | Status: AC | PRN
  Administered 2013-09-12 (×2): 1 via INTRAVENOUS

## 2013-09-12 MED ORDER — LIDOCAINE HCL (CARDIAC) 20 MG/ML IV SOLN
INTRAVENOUS | Status: AC
  Filled 2013-09-12: qty 5

## 2013-09-12 MED ORDER — SUCCINYLCHOLINE CHLORIDE 20 MG/ML IJ SOLN
INTRAMUSCULAR | Status: DC
Start: 2013-09-12 — End: 2013-09-12
  Filled 2013-09-12: qty 1

## 2013-09-12 MED ORDER — ETOMIDATE 2 MG/ML IV SOLN
INTRAVENOUS | Status: DC
Start: 2013-09-12 — End: 2013-09-12
  Filled 2013-09-12: qty 20

## 2013-09-12 MED ORDER — ATROPINE SULFATE 1 MG/ML IJ SOLN
INTRAMUSCULAR | Status: AC | PRN
  Administered 2013-09-12 (×2): 1 mg via INTRAVENOUS

## 2013-09-12 MED ORDER — SODIUM CHLORIDE 0.9 % IV SOLN
INTRAVENOUS | Status: AC | PRN
  Administered 2013-09-12: 999 mL/h via INTRAVENOUS

## 2013-09-13 MED FILL — Medication: Qty: 1 | Status: AC

## 2013-09-25 NOTE — ED Provider Notes (Signed)
CSN: [id number]     Arrival date & time 09/28/2013  1224 History   First MD Initiated Contact with Patient 09-28-2013 1234     Chief Complaint  Patient presents with  . Chest Pain  . Shortness of Breath     (Consider location/radiation/quality/duration/timing/severity/associated sxs/prior Treatment) The history is provided by the patient. The history is limited by the condition of the patient.  Kimberly Gaines is a 80 y.o. female hx of type 2 AV block, RA, UTI, Afib not on coumadin here with chest pain. She was at physical therapy this morning and had sudden onset of substernal chest pain with diaphoresis. She came by EMS and was given 4 baby aspirin and 2 nitroglycerin's. She was hypoxic en route, around 80% on RA.    Level V caveat- condition of patient      Past Medical History  Diagnosis Date  . Iron deficiency anemia, unspecified   . Chronic diastolic heart failure 2012  . Unspecified constipation 2013  . 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 Edema 2012  . Reflux esophagitis 2013  . Unspecified essential hypertension 2012  . Mobitz (type) II atrioventricular block 2012  . Senile osteoporosis   . Personal history of malignant neoplasm of breast 1998    Left mastectomy  . Postmenopausal atrophic vaginitis 2014  . Senile cataract, unspecified 08/2012    Left extration/IOL implant  . Arthritis   . Osteoarthrosis, unspecified whether generalized or localized, lower leg 2008    s/p bilateral TKA  . Rheumatoid arthritis(714.0) 2013  . Pain in joint, lower leg 08/06/2012  . Dysuria 07/02/2012  . Urinary tract infection, site not specified 06/18/2012  . Reflux esophagitis 04/16/2012  . Pain in joint, forearm 12/26/2011  . Impacted cerumen 03/09/2011  . Anemia of other chronic disease 02/07/2011  . Urinary frequency 11/03/2010  . Pain in joint, site unspecified 07/20/2010  . Encounter for long-term (current) use of other medications  263.8    07/17/2010  . Other specified hypotension 07/17/2010  . Unspecified adverse effect of other drug, medicinal and biological substance(995.29) 07/17/2010  . Debility, unspecified 07/16/2010  . Dizziness and giddiness 07/17/2005  . Fracture, humerus 09/29/12    proximal left  . Unspecified hypothyroidism 2012  . Atrial fibrillation 02/01/2013   Past Surgical History  Procedure Laterality Date  . Appendectomy  1978  . Abdominal hysterectomy  1978  . Mastectomy Left 1998  . Cataract extraction w/ intraocular lens  implant, bilateral  3295,1884    Rt 2003, left 2014  . Joint replacement Bilateral     Knees  . Breast surgery Left 1998    mastectomy  . Eye surgery Right 2003    cataract extraction/IOL implant  . External fixation leg Left 02/28/2013    Procedure: EXTERNAL FIXATION LEFT ANKLE;  Surgeon: Cheral Almas, MD;  Location: [facility] OR;  Service: Orthopedics;  Laterality: Left;  . External fixation removal Left 04/17/2013    Procedure: REMOVAL EXTERNAL FIXATION LEFT ANKLE AND CASTING;  Surgeon: Cheral Almas, MD;  Location: [facility] OR;  Service: Orthopedics;  Laterality: Left;   Family History  Problem Relation Age of Onset  . Pneumonia Brother   . Drug abuse Son    History  Substance Use Topics  . Smoking status: Never Smoker   . Smokeless tobacco: Never Used  . Alcohol Use: Yes     Comment: occ   OB History   Grav  Para Term Preterm Abortions TAB SAB Ect Mult Living                 Review of Systems  Respiratory: Positive for shortness of breath.   Cardiovascular: Positive for chest pain.  All other systems reviewed and are negative.      Allergies  Aleve  Home Medications   Current Outpatient Rx  Name  Route  Sig  Dispense  Refill  . acetaminophen (TYLENOL) 325 MG tablet   Oral   Take 650 mg by mouth 3 (three) times daily.         . Calcium Carbonate-Vitamin D (CALCIUM 600+D) 600-400 MG-UNIT per tablet   Oral   Take 1 tablet by mouth 2 (two) times  daily.         . carvedilol (COREG) 3.125 MG tablet   Oral   Take 3.125 mg by mouth 2 (two) times daily with a meal.         . digoxin (LANOXIN) 0.125 MG tablet   Oral   Take 0.125 mg by mouth daily.         [facility] docusate sodium (COLACE) 100 MG capsule   Oral   Take 100 mg by mouth daily.         . ferrous sulfate 325 (65 FE) MG tablet   Oral   Take 325 mg by mouth 2 (two) times daily.         [facility] HYDROcodone-acetaminophen (NORCO/VICODIN) 5-325 MG per tablet   Oral   Take 1 tablet by mouth every 6 (six) hours as needed for pain.         [facility] levothyroxine (SYNTHROID, LEVOTHROID) 100 MCG tablet   Oral   Take 100 mcg by mouth daily before breakfast.         . losartan (COZAAR) 50 MG tablet   Oral   Take 50 mg by mouth every morning.         . Multiple Vitamin (MULTIVITAMIN WITH MINERALS) TABS tablet   Oral   Take 1 tablet by mouth daily.         . pantoprazole (PROTONIX) 40 MG tablet   Oral   Take 40 mg by mouth daily.         . polyethylene glycol (MIRALAX / GLYCOLAX) packet   Oral   Take 17 g by mouth daily.   14 each   0   . predniSONE (DELTASONE) 20 MG tablet   Oral   Take 20 mg by mouth daily.         . promethazine (PHENERGAN) 25 MG tablet   Oral   Take 1 tablet (25 mg total) by mouth every 6 (six) hours as needed for nausea.   30 tablet   0   . spironolactone (ALDACTONE) 25 MG tablet   Oral   Take 25 mg by mouth daily.          BP 74/41  Pulse 111  Resp 32  SpO2 98% Physical Exam  Nursing note and vitals reviewed. Constitutional:  Chronically ill. tachypneic   HENT:  Head: Normocephalic.  Mouth/Throat: Oropharynx is clear and moist.  Eyes: Conjunctivae are normal.  Neck: Normal range of motion. Neck supple.  Cardiovascular: Normal rate, regular rhythm and normal heart sounds.   Pulmonary/Chest:  Tachypneic. Crackles bilateral bases   Abdominal: Soft. Bowel sounds are normal. She exhibits no distension. There is no  tenderness. There is no rebound.  Musculoskeletal: Normal range of motion. She exhibits  edema.  Neurological: She is alert.  Demented, moving all extremities   Skin: Skin is warm and dry.  Psychiatric: She has a normal mood and affect. Her behavior is normal. Judgment and thought content normal.    ED Course  Procedures (including critical care time)  CRITICAL CARE Performed by: Silverio Lay, DAVID   Total critical care time: 20 min   Critical care time was exclusive of separately billable procedures and treating other patients.  Critical care was necessary to treat or prevent imminent or life-threatening deterioration.  Critical care was time spent personally by me on the following activities: development of treatment plan with patient and/or surrogate as well as nursing, discussions with consultants, evaluation of patient's response to treatment, examination of patient, obtaining history from patient or surrogate, ordering and performing treatments and interventions, ordering and review of laboratory studies, ordering and review of radiographic studies, pulse oximetry and re-evaluation of patient's condition.  Cardiopulmonary Resuscitation (CPR) Procedure Note Directed/Performed by: Chaney Malling I personally directed ancillary staff and/or performed CPR in an effort to regain return of spontaneous circulation and to maintain cardiac, neuro and systemic perfusion.     EMERGENCY DEPARTMENT [place] CARDIAC EXAM "Study: Limited Ultrasound of the heart and pericardium"  INDICATIONS:Cardiac arrest Multiple views of the heart and pericardium are obtained with a multi-frequency probe.  PERFORMED IN:[facility]  IMAGES ARCHIVED?: No  FINDINGS: No cardiac activity  LIMITATIONS:  Emergent procedure  VIEWS USED: Subcostal 4 chamber  INTERPRETATION: Cardiac activity absent  COMMENT:  No cardiac motion    Labs Review Labs Reviewed  CBC WITH DIFFERENTIAL - Abnormal; Notable for the following:    WBC  20.6 (*)    RBC 3.86 (*)    MCV 103.1 (*)    RDW 15.7 (*)    All other components within normal limits  PRO B NATRIURETIC PEPTIDE - Abnormal; Notable for the following:    Pro B Natriuretic peptide (BNP) 599.4 (*)    All other components within normal limits  I-STAT TROPOININ, ED - Abnormal; Notable for the following:    Troponin i, poc 0.15 (*)    All other components within normal limits  COMPREHENSIVE METABOLIC PANEL  DIGOXIN LEVEL  URINALYSIS, ROUTINE W REFLEX MICROSCOPIC   Imaging Review Dg Chest Portable 1 View  Sep 17, 2013   CLINICAL DATA:  Shortness of breath  EXAM: PORTABLE CHEST - 1 VIEW  COMPARISON:  February 28, 2013  FINDINGS: The heart size and mediastinal contours are stable. The heart size is enlarged. There are small bilateral pleural effusions, left greater than right. There is no focal pneumonia or pulmonary edema. The visualized skeletal structures are stable.  IMPRESSION: Cardiomegaly.  Small bilateral pleural effusions.   Electronically Signed   By: Sherian Rein M.D.   On: 09-17-2013 12:58     EKG Interpretation   Date/Time:  Thursday 2013/09/17 12:33:11 EDT Ventricular Rate:  74 PR Interval:    QRS Duration: 132 QT Interval:  381 QTC Calculation: 423 R Axis:   -42 Text Interpretation:  afib Accelerated junctional rhythm RBBB and LAFB  Left ventricular hypertrophy No significant change since last tracing  Confirmed by YAO  MD, DAVID ([id number]) on 17-Sep-2013 12:35:53 PM      MDM   Final diagnoses:  Cardiac arrest   AVANNA RUESCH is a 80 y.o. female here with chest pain, diaphoresis. Concerned for ACS. She is hypoxic on nonrebreather. I ordered bipap.   1 PM Patient became bradycardic and unresponsive. Had thready pulse.  I was at the bedside. Atropine 1 mg given and pulse increased to 70s.   1:15 pm Patient became pulseless. ACLS protocol initiated.   1:20 PM Patient regained pulse and is still hypoxic. I tried to call family to discuss  code status since she doesn't have DNR in the chart. I called Dr. Jens Som, cardiologist, who states that he wouldn't recommend any cardiology intervention even though troponin mildly elevated.   1:30 PM Patient coded again. ACLS initiated. I called son, Derriana Oser, who discussed with her daughter and made her DNR. She regained pulse in the mean time.   1:50 PM Family arrived and I talked to son and daughter. Bedside US showed no cardiac motion. She has no pulse. Code declared at 1:50 pm  2:16 PM I called Dr. Chilton Si, from [facility], who will fill out death certificate.        Richardean Canal, MD 10/02/13 [phone]

## 2013-09-25 NOTE — Assessment & Plan Note (Signed)
Acute onset shortness of breath, chest pain, diaphoresis accompanied by nausea and vomiting. Patient received 2 nitroglycerin tablets with some relief of chest discomfort. Patient has chronic atrial fibrillation, EKG shows this along with nonspecific block. She does receive digoxin and Coreg daily. Currently not anticoagulated due to recent GI bleed.[facility] was called and has transported her to [facility] emergency department. Attempts have been made to contact her son and daughter, messages were left.

## 2013-09-25 NOTE — Code Documentation (Signed)
Loss of pulses. Compressions resumed. 

## 2013-09-25 NOTE — Code Documentation (Signed)
Patient time of death occurred at 1350 .

## 2013-09-25 NOTE — Code Documentation (Signed)
Per son, pt is a DNR.

## 2013-09-25 NOTE — Progress Notes (Signed)
Patient ID: Kimberly Gaines, female   DOB: 05-16-1920, 80 y.o.   MRN: [id number]   [facility] [phone])  Code Status: Full Code Contact Information   Name Relation Home Work [place] A Son [phone]   Biggins,Veronica Daughter [phone]   Jossie, Smoot [phone]  [phone]       Chief Complaint  Patient presents with  . Chest Pain    HPI: This is a 80 y.o. female resident of [facility].  Evaluation is requested today due to chest pain.  Patient was working with PT, sitting in her wheelchair pedaling bicycle. Developed dizziness and some shortness of breath. The nurse was immediately called. She did develop some chest pain. She was given one dose of nitroglycerin. Initial vital signs blood pressure 145/91 pulse 85, O2 saturation 83% on room air. OTC a nasal cannula was started, saturation improved to 92% after several minutes dropped again to 86%. The patient continued to have chest pain and pressure along with diaphoresis, shortness of breath, vomiting. EMS was called. She was given a second dose of nitroglycerin. Repeat blood pressure 98/70. EKG showed atrial fibrillation, rate approximately 85 beats per minute, nonspecific block. After second nitroglycerin chest pain was relieved somewhat though not completely. EMS personnel as given patient's current records including medication list. I mistakenly told she was anticoagulated with Coumadin, this medication was stopped with recent GI bleed. She's not currently anticoagulated. Attempts were made to reach the patient's son, Gabriel Rung who lives locally, messages were left on his home and at work answering machines. Nurse also attempted to contact the daughter, Suzette Battiest who lives out of town. Unable to reach either person prior to patient's transfer to hospital.   Prior  to this acute change in  condition the patient was in her usual state of health; eating and drinking as usual, took all of her medications this morning, ate her usual breakfast.      Allergies  Allergen Reactions  . Aleve [Naproxen Sodium] Other (See Comments)    Stomach bleeding.     MEDICATIONS -     Medication List       This list is accurate as of: 2013-09-14 12:10 PM.  Always use your most recent med list.               acetaminophen 325 MG tablet  Commonly known as:  TYLENOL  Take 650 mg by mouth 3 (three) times daily.     CALCIUM 600+D 600-400 MG-UNIT per tablet  Generic drug:  Calcium Carbonate-Vitamin D  Take 1 tablet by mouth 2 (two) times daily.     carvedilol 3.125 MG tablet  Commonly known as:  COREG  Take 3.125 mg by mouth 2 (two) times daily with a meal.     digoxin 0.125 MG tablet  Commonly known as:  LANOXIN  Take 0.125 mg by mouth daily.     docusate sodium 100 MG capsule  Commonly known as:  COLACE  Take 100 mg by mouth daily.     ferrous sulfate 325 (65 FE) MG tablet  Take 325 mg by mouth 2 (two) times daily.     HYDROcodone-acetaminophen 5-325 MG per tablet  Commonly known as:  NORCO/VICODIN  Take 1 tablet by mouth every 6 (six) hours as needed for pain.     levothyroxine 100 MCG tablet  Commonly known as:  SYNTHROID, LEVOTHROID  Take 100 mcg by mouth daily before breakfast.  losartan 50 MG tablet  Commonly known as:  COZAAR  Take 50 mg by mouth every morning.     multivitamin with minerals Tabs tablet  Take 1 tablet by mouth daily.     pantoprazole 40 MG tablet  Commonly known as:  PROTONIX  Take 40 mg by mouth daily.     polyethylene glycol packet  Commonly known as:  MIRALAX / GLYCOLAX  Take 17 g by mouth daily.     predniSONE 20 MG tablet  Commonly known as:  DELTASONE  Take 20 mg by mouth daily.     promethazine 25 MG tablet  Commonly known as:  PHENERGAN  Take 1 tablet (25 mg total) by mouth every 6 (six) hours as needed for nausea.      spironolactone 25 MG tablet  Commonly known as:  ALDACTONE  Take 25 mg by mouth daily.         DATA REVIEWED  Radiologic Exams:   Cardiovascular Exams:   Laboratory Studies: Lab Results  Component Value Date   WBC 9.7 09/03/2013   HGB 10.2* 09/03/2013   HCT 30* 09/03/2013   MCV 94.9 02/28/2013   PLT 227 09/03/2013   Lab Results  Component Value Date   NA 142 08/06/2013   K 3.9 08/06/2013   GLU 79 08/06/2013   BUN 22* 08/06/2013   CREATININE 1.4* 08/06/2013    Lab Results  Component Value Date   VITAMINB12 480 07/08/2010     REVIEW OF SYSTEMS   - see HPI  PHYSICAL EXAM Filed Vitals:   20-Sep-2013 1159  BP: 145/91  Pulse: 85  SpO2: 83%   There is no weight on file to calculate BMI.  GENERAL APPEARANCE: Moderate distress, Alert, able to answer questions SKIN: Diaphoresis present  HEAD: Normocephalic, atraumatic EYES: Conjunctiva/lids clear  RESPIRATORY: Breathing is Labored, clear anterior, few crackles at bases posterior  and full  CARDIOVASCULAR: Heart IRRR   No murmur or extra heart sounds   EDEMA: No peripheral edema   PSYCHIATRIC:  Frightened, asking that her family be ocntacted    ASSESSMENT/PLAN  Chest pain Acute onset shortness of breath, chest pain, diaphoresis accompanied by nausea and vomiting. Patient received 2 nitroglycerin tablets with some relief of chest discomfort. Patient has chronic atrial fibrillation, EKG shows this along with nonspecific block. She does receive digoxin and Coreg daily. Currently not anticoagulated due to recent GI bleed.[facility] was called and has transported her to [facility] emergency department. Attempts have been made to contact her son and daughter, messages were left.    Family/ staff Communication:     Labs/tests ordered:    Follow up: AS needed  Toribio Harbour, NP-C [facility] [phone]  09/20/13

## 2013-09-25 NOTE — Progress Notes (Signed)
PT did not tolerate BIPAP. Did not even get the mask on her before she refused

## 2013-09-25 NOTE — Code Documentation (Signed)
Pt placed on Bipap. Pt making purposeful movements.

## 2013-09-25 NOTE — Code Documentation (Signed)
Resp at bedside continuing to bag pt. Pt remains unresponsive.

## 2013-09-25 NOTE — Code Documentation (Signed)
Family at beside. Family given emotional support. 

## 2013-09-25 NOTE — Code Documentation (Signed)
Pulse check; ROSC

## 2013-09-25 NOTE — Code Documentation (Signed)
Md Silverio Lay on phone with son, Skyeler Scalese discussing plan of care.

## 2013-09-25 NOTE — Progress Notes (Signed)
Chaplain requested for CPR patient. Was told by charge nurse that physician spoke with family over the phone and patient was DNR. Presented to patient's son and daughter-in-law in waiting area when they arrived and escorted them to trauma room B. Was with family when patient died. Chaplain provided emotional and spiritual support, prayer, empathic listening, and prayer. Family greatly appreciated support. They are awaiting arrival of funeral home.   Maurene Capes [phone]

## 2013-09-25 NOTE — ED Notes (Signed)
Tim, ME went downstairs with [employer] employee to sign pt out, pt was transported by [facility] directly to funeral home, ben control is aware.

## 2013-09-25 NOTE — ED Notes (Signed)
Pt arrived by [facility] from [facility]. Pt was doing PT and sudden onset of chest pain, sob, skin clammy, n/v. Received 2 nitro at NH which helped decrease the pain. Appears in resp distress on ems arrival. Given 324mg  asa and zofran 4mg  pta.

## 2013-09-25 NOTE — Code Documentation (Signed)
Loss of pulses. Compressions started.

## 2013-09-25 NOTE — Code Documentation (Signed)
Pt placed on NRB.

## 2013-09-25 NOTE — Code Documentation (Signed)
ROSC: HR 84 on monitor

## 2013-09-25 DEATH — deceased

## 2014-08-27 IMAGING — CT CT ANKLE*L* W/O CM
2 of 4 series · 7 of 33 positions shown, 8 images · non-contrast
Comparison: 02/28/2013 intraoperative a images.

CLINICAL DATA: Trimalleolar fracture, status post external fixator
placement.

EXAM:
CT OF THE LEFT ANKLE WITHOUT CONTRAST
TECHNIQUE: Multidetector CT imaging was performed according to the standard
protocol. Multiplanar CT image reconstructions were also generated.

[Series 4: o-mar, soft tissue · axial · 0.31mm/px · z∈[-333,-193]mm · 4 of 84 slices shown, 5 images]
[im 14/84  soft-tissue]
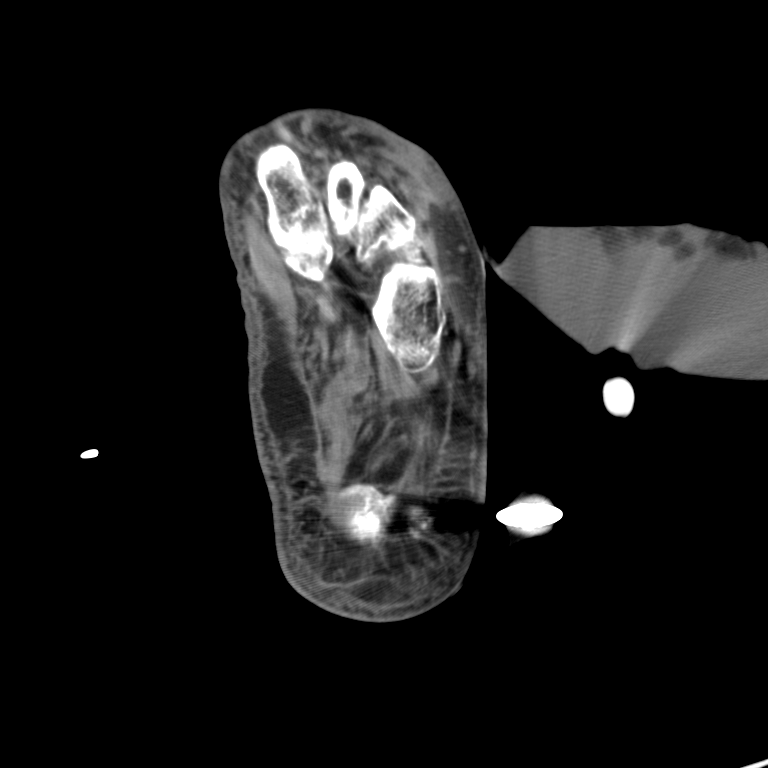
[im 14/84  bone]
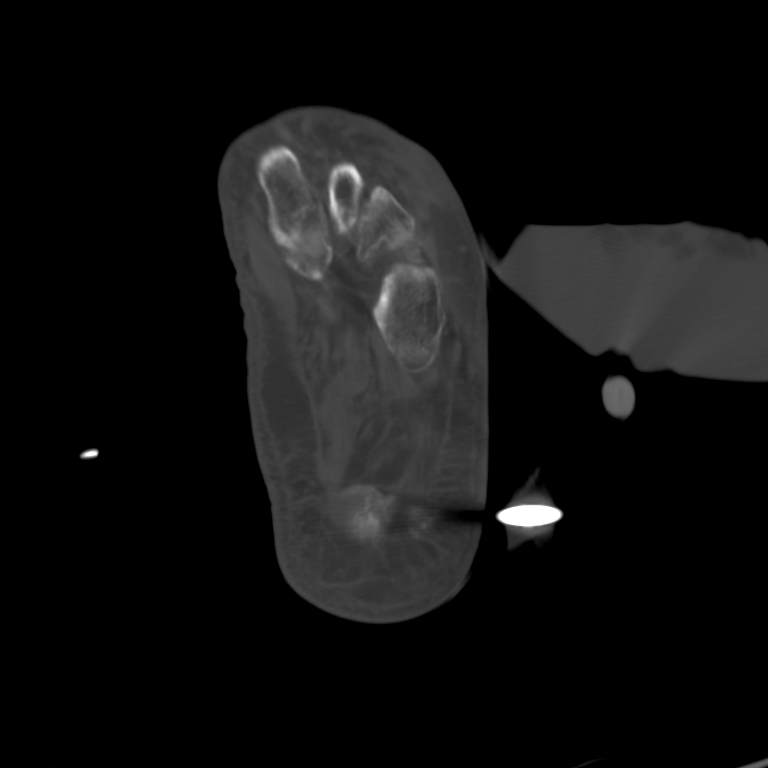
[im 28/84  bone]
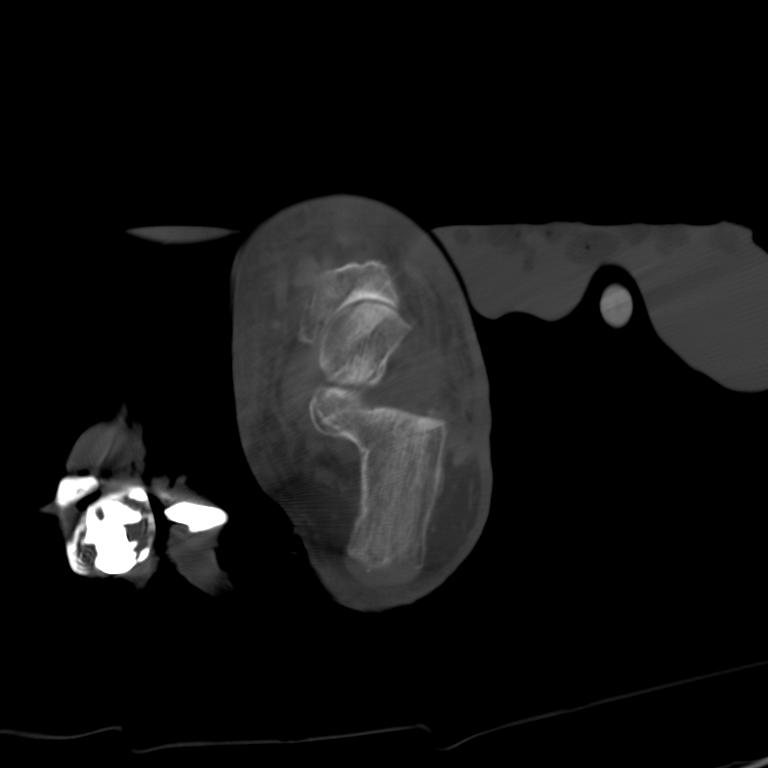
[im 56/84  bone]
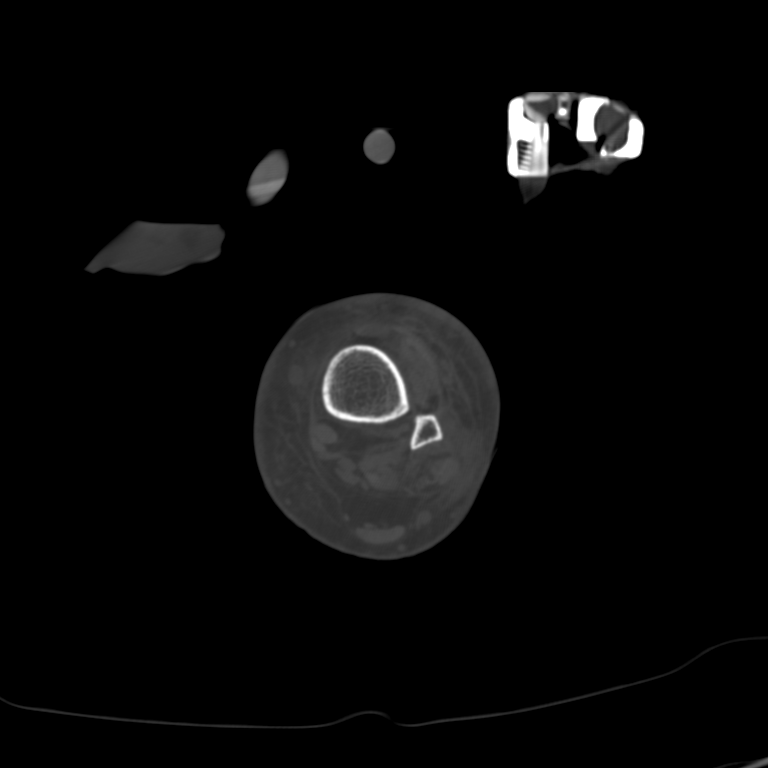
[im 70/84  bone]
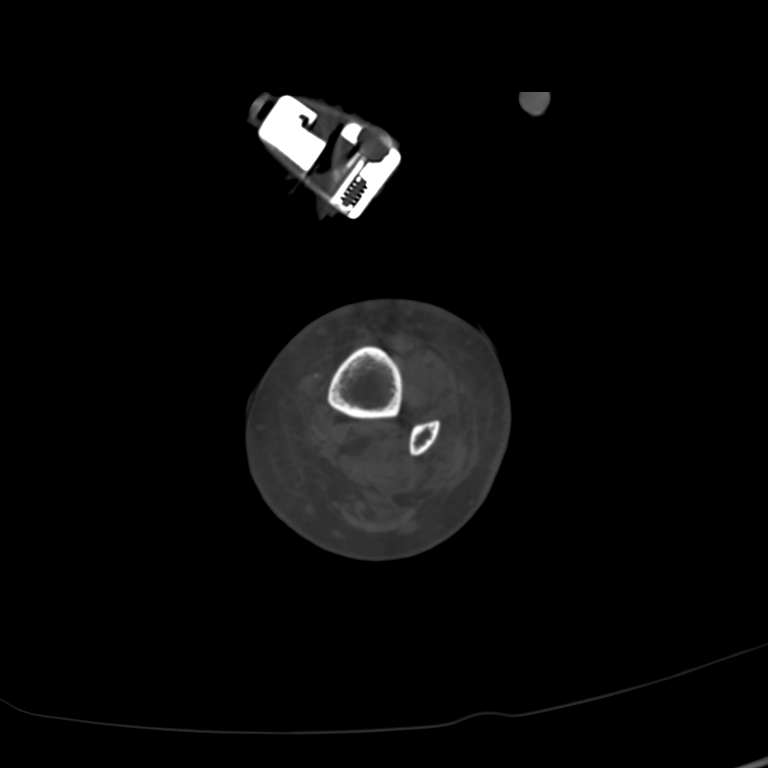

[o-mar, bone coronals · coronal · 0.31mm/px · 3 of 80 slices shown]
[im 16/80  bone]
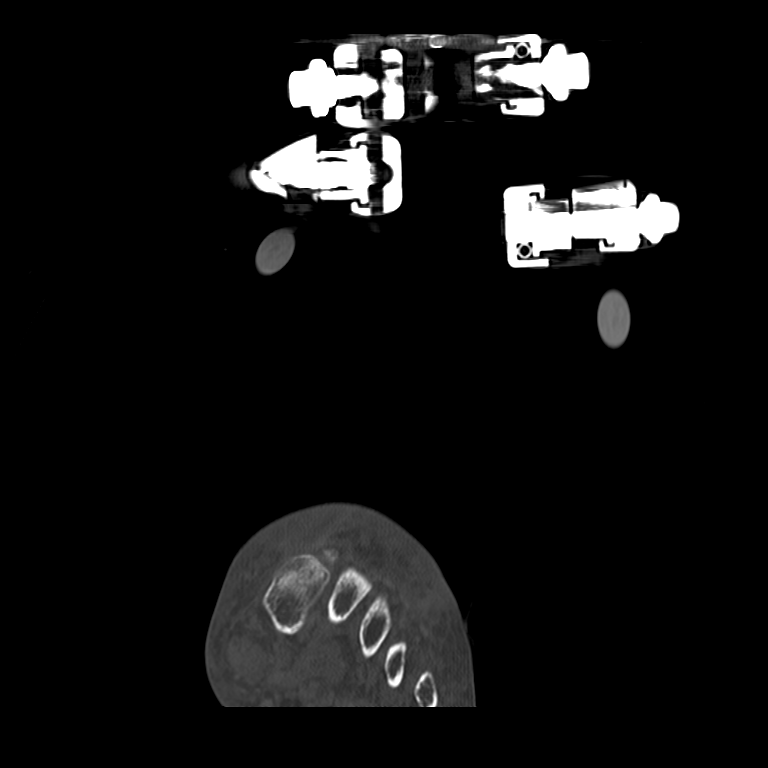
[im 32/80  bone]
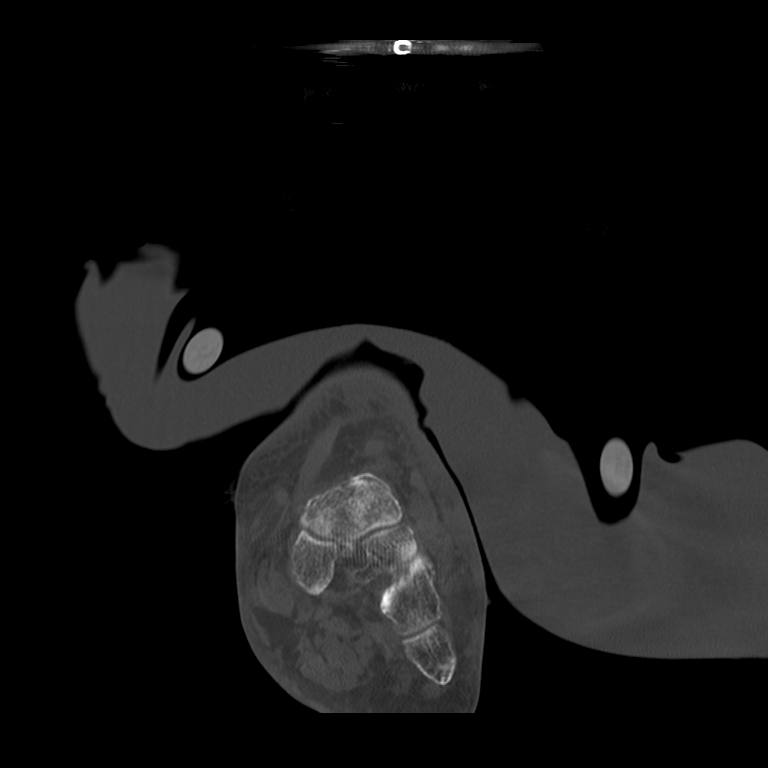
[im 48/80  bone]
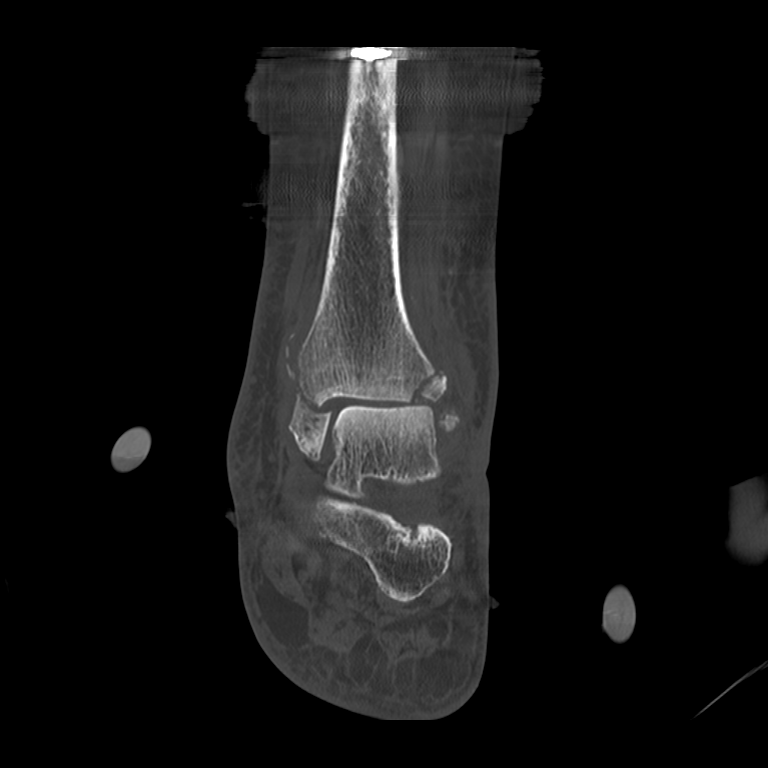

[7 of 33 positions shown; findings below may reference images not displayed]

FINDINGS: Trimalleolar fracture includes an oblique moderately comminuted
fracture of the distal fibula; a primarily transverse by
considerably comminuted fracture of the medial malleolus; atypical
oblique posterior malleolar fracture with moderate combination; and
a separate fracture of the anterior lateral corner of the distal
tibia extending to the articular surface is shown on image 48 of
series 01380.

Tibiotalar distance reduced laterally. The dominant medial malleolar
fragment maintains expected alignment with the talus, but this
fragment and the talus are displaced 4 mm with respect to the
dominant tibial shaft fragment.

Plantar calcaneal spur noted with thickening of the medial band of
the plantar fascia. External fixator apparatus observed; the tibial
screw is barely included at the top of the images. No complicating
feature related to the transverse calcaneal rod component.

There is some subtle lucency in the inferolateral margin of the
talar dome on image 45 of series [DATE] which could reflect an
osteochondral lesion.

On images 52 and 53 of series 01380 there is concern for a
nondisplaced fracture of the sustentaculum tali, but this is not
readily confirmed on the other imaging planes.

A small accessory navicular is observed.
IMPRESSION: *Trimalleolar fracture with considerable comminution is noted above.
There is also a fracture of the anterolateral corner of the distal
tibia extending into the distal articular surface.
*The main tibial shaft component is displaced 4 millimeters medially
with respect to the talar dome and medial malleolar fragment.
*Faint linear lucency in the sustentaculum tali have may well be
artifactual, but a subtle nondisplaced fracture is difficult to
exclude.
